# Patient Record
Sex: Female | Born: 1995 | Race: White | Hispanic: No | Marital: Single | Smoking: Never smoker
Health system: Southern US, Community
[De-identification: ages and names within clinical notes are randomized; demographics above are authoritative.]

## PROBLEM LIST (undated history)

## (undated) DIAGNOSIS — B379 Candidiasis, unspecified: Secondary | ICD-10-CM

## (undated) DIAGNOSIS — B977 Papillomavirus as the cause of diseases classified elsewhere: Secondary | ICD-10-CM

## (undated) DIAGNOSIS — F3181 Bipolar II disorder: Secondary | ICD-10-CM

## (undated) DIAGNOSIS — F603 Borderline personality disorder: Secondary | ICD-10-CM

## (undated) DIAGNOSIS — Z789 Other specified health status: Secondary | ICD-10-CM

## (undated) DIAGNOSIS — N94819 Vulvodynia, unspecified: Secondary | ICD-10-CM

## (undated) DIAGNOSIS — D649 Anemia, unspecified: Secondary | ICD-10-CM

## (undated) DIAGNOSIS — N200 Calculus of kidney: Secondary | ICD-10-CM

## (undated) DIAGNOSIS — F419 Anxiety disorder, unspecified: Secondary | ICD-10-CM

## (undated) DIAGNOSIS — N390 Urinary tract infection, site not specified: Secondary | ICD-10-CM

## (undated) DIAGNOSIS — N730 Acute parametritis and pelvic cellulitis: Secondary | ICD-10-CM

## (undated) HISTORY — DX: Urinary tract infection, site not specified: N39.0

## (undated) HISTORY — DX: Calculus of kidney: N20.0

## (undated) HISTORY — DX: Anxiety disorder, unspecified: F41.9

## (undated) HISTORY — DX: Borderline personality disorder: F60.3

## (undated) HISTORY — DX: Candidiasis, unspecified: B37.9

## (undated) HISTORY — DX: Papillomavirus as the cause of diseases classified elsewhere: B97.7

## (undated) HISTORY — DX: Bipolar II disorder: F31.81

## (undated) HISTORY — DX: Acute parametritis and pelvic cellulitis: N73.0

## (undated) HISTORY — PX: NO PAST SURGERIES: SHX2092

## (undated) HISTORY — DX: Anemia, unspecified: D64.9

## (undated) HISTORY — DX: Vulvodynia, unspecified: N94.819

---
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
## 2017-08-06 NOTE — L&D Delivery Note (Signed)
Operative Delivery Note At 10:00 AM a viable female was delivered via Vaginal, Forceps.  Presentation: vertex; Position: straight OP; Station: +2.  Verbal consent: obtained from patient.  Risks and benefits discussed in detail.  Risks include, but are not limited to the risks of anesthesia, bleeding, infection, damage to maternal tissues, fetal cephalhematoma.  There is also the risk of inability to effect vaginal delivery of the head, or shoulder dystocia that cannot be resolved by established maneuvers, leading to the need for emergency cesarean section.  APGAR: 9, 9; weight pending.   Placenta status: spontaneous, intact, circumvallate.   Cord: 3VC, body/shoulder cord x 1 with the following complications: bradycardia shortly after AROM, patient noted to be complete, little descent with pushing.  Cord pH: N/A  Anesthesia: Epidural   Instruments: Short fenestrated Elliots Episiotomy:  none Lacerations:  none Suture Repair: N/A Est. Blood Loss (mL):  300mL  Mom to postpartum.  Baby to Couplet care / Skin to Skin.  Sara Mcneil 02/16/2018, 10:10 AM

## 2017-11-12 DIAGNOSIS — F419 Anxiety disorder, unspecified: Secondary | ICD-10-CM | POA: Insufficient documentation

## 2017-11-12 DIAGNOSIS — N390 Urinary tract infection, site not specified: Secondary | ICD-10-CM | POA: Insufficient documentation

## 2017-11-12 LAB — OB RESULTS CONSOLE VARICELLA ZOSTER ANTIBODY, IGG: VARICELLA IGG: NON-IMMUNE/NOT IMMUNE

## 2017-11-12 LAB — OB RESULTS CONSOLE RUBELLA ANTIBODY, IGM: RUBELLA: UNDETERMINED

## 2017-11-12 LAB — OB RESULTS CONSOLE HEPATITIS B SURFACE ANTIGEN: HEP B S AG: NEGATIVE

## 2017-11-12 LAB — OB RESULTS CONSOLE HIV ANTIBODY (ROUTINE TESTING): HIV: NONREACTIVE

## 2017-11-12 LAB — OB RESULTS CONSOLE RPR: RPR: NONREACTIVE

## 2017-11-18 DIAGNOSIS — O09899 Supervision of other high risk pregnancies, unspecified trimester: Secondary | ICD-10-CM | POA: Insufficient documentation

## 2018-01-13 DIAGNOSIS — O280 Abnormal hematological finding on antenatal screening of mother: Secondary | ICD-10-CM | POA: Insufficient documentation

## 2018-01-13 DIAGNOSIS — O99013 Anemia complicating pregnancy, third trimester: Secondary | ICD-10-CM | POA: Insufficient documentation

## 2018-01-13 DIAGNOSIS — D509 Iron deficiency anemia, unspecified: Secondary | ICD-10-CM | POA: Insufficient documentation

## 2018-02-12 LAB — OB RESULTS CONSOLE GC/CHLAMYDIA
CHLAMYDIA, DNA PROBE: NEGATIVE
Gonorrhea: NEGATIVE

## 2018-02-12 LAB — OB RESULTS CONSOLE GBS: GBS: NEGATIVE

## 2018-02-16 ENCOUNTER — Other Ambulatory Visit: Payer: Self-pay

## 2018-02-16 ENCOUNTER — Inpatient Hospital Stay
Admission: EM | Admit: 2018-02-16 | Discharge: 2018-02-18 | DRG: 807 | Disposition: A | Discharge status: Home / Self Care | Payer: Medicaid Other | Attending: Obstetrics and Gynecology | Admitting: Obstetrics and Gynecology

## 2018-02-16 ENCOUNTER — Inpatient Hospital Stay: Payer: Medicaid Other | Admitting: Anesthesiology

## 2018-02-16 DIAGNOSIS — O9902 Anemia complicating childbirth: Secondary | ICD-10-CM

## 2018-02-16 DIAGNOSIS — O99344 Other mental disorders complicating childbirth: Secondary | ICD-10-CM | POA: Diagnosis present

## 2018-02-16 DIAGNOSIS — O9081 Anemia of the puerperium: Secondary | ICD-10-CM | POA: Diagnosis not present

## 2018-02-16 DIAGNOSIS — Z3A35 35 weeks gestation of pregnancy: Secondary | ICD-10-CM | POA: Diagnosis not present

## 2018-02-16 DIAGNOSIS — O9989 Other specified diseases and conditions complicating pregnancy, childbirth and the puerperium: Secondary | ICD-10-CM | POA: Diagnosis not present

## 2018-02-16 DIAGNOSIS — F419 Anxiety disorder, unspecified: Secondary | ICD-10-CM | POA: Diagnosis present

## 2018-02-16 HISTORY — DX: Other specified health status: Z78.9

## 2018-02-16 LAB — TYPE AND SCREEN
ABO/RH(D): O POS
ANTIBODY SCREEN: NEGATIVE

## 2018-02-16 LAB — CBC
HEMATOCRIT: 29 % — AB (ref 35.0–47.0)
HEMOGLOBIN: 9.7 g/dL — AB (ref 12.0–16.0)
MCH: 27.9 pg (ref 26.0–34.0)
MCHC: 33.3 g/dL (ref 32.0–36.0)
MCV: 83.6 fL (ref 80.0–100.0)
Platelets: 232 10*3/uL (ref 150–440)
RBC: 3.47 MIL/uL — ABNORMAL LOW (ref 3.80–5.20)
RDW: 16.3 % — ABNORMAL HIGH (ref 11.5–14.5)
WBC: 12.8 10*3/uL — AB (ref 3.6–11.0)

## 2018-02-16 MED ORDER — LIDOCAINE HCL 1 % IJ SOLN
20.00 | INTRAMUSCULAR | Status: DC
Start: ? — End: 2018-02-16

## 2018-02-16 MED ORDER — ACETAMINOPHEN 325 MG PO TABS: 650.0000 mg | ORAL_TABLET | ORAL | Status: DC | PRN

## 2018-02-16 MED ORDER — LACTATED RINGERS IV SOLN: 500.0000 mL | INTRAVENOUS | Status: DC | PRN

## 2018-02-16 MED ORDER — LACTATED RINGERS IV SOLN
INTRAVENOUS | Status: DC
  Administered 2018-02-16: 07:00:00 via INTRAVENOUS

## 2018-02-16 MED ORDER — DIPHENHYDRAMINE HCL 50 MG/ML IJ SOLN: 12.5000 mg | INTRAMUSCULAR | Status: DC | PRN

## 2018-02-16 MED ORDER — DIPHENHYDRAMINE HCL 25 MG PO CAPS: 25.0000 mg | ORAL_CAPSULE | Freq: Four times a day (QID) | ORAL | Status: DC | PRN

## 2018-02-16 MED ORDER — BUTORPHANOL TARTRATE 1 MG/ML IJ SOLN
1.0000 mg | INTRAMUSCULAR | Status: DC | PRN
Start: 2018-02-16 — End: 2018-02-16
  Administered 2018-02-16 (×2): 1 mg via INTRAVENOUS
  Filled 2018-02-16 (×2): qty 1

## 2018-02-16 MED ORDER — EPHEDRINE 5 MG/ML INJ
10.0000 mg | INTRAVENOUS | Status: DC | PRN
  Filled 2018-02-16: qty 2

## 2018-02-16 MED ORDER — PHENYLEPHRINE 40 MCG/ML (10ML) SYRINGE FOR IV PUSH (FOR BLOOD PRESSURE SUPPORT)
80.0000 ug | PREFILLED_SYRINGE | INTRAVENOUS | Status: DC | PRN
  Filled 2018-02-16: qty 5

## 2018-02-16 MED ORDER — WITCH HAZEL-GLYCERIN EX PADS
1.0000 "application " | MEDICATED_PAD | CUTANEOUS | Status: DC | PRN
  Filled 2018-02-16: qty 100

## 2018-02-16 MED ORDER — COCONUT OIL OIL
1.0000 "application " | TOPICAL_OIL | Status: DC | PRN
  Administered 2018-02-16: 1 via TOPICAL
  Filled 2018-02-16: qty 120

## 2018-02-16 MED ORDER — FENTANYL 2.5 MCG/ML W/ROPIVACAINE 0.15% IN NS 100 ML EPIDURAL (ARMC): 12.0000 mL/h | EPIDURAL | Status: DC

## 2018-02-16 MED ORDER — FENTANYL 2.5 MCG/ML W/ROPIVACAINE 0.15% IN NS 100 ML EPIDURAL (ARMC)
EPIDURAL | Status: AC
  Filled 2018-02-16: qty 100

## 2018-02-16 MED ORDER — MISOPROSTOL 200 MCG PO TABS
1000.00 | ORAL_TABLET | ORAL | Status: DC
Start: ? — End: 2018-02-16

## 2018-02-16 MED ORDER — OXYTOCIN 40 UNITS IN LACTATED RINGERS INFUSION - SIMPLE MED
2.5000 [IU]/h | INTRAVENOUS | Status: DC
  Administered 2018-02-16: 2.5 [IU]/h via INTRAVENOUS
  Filled 2018-02-16: qty 1000

## 2018-02-16 MED ORDER — LACTATED RINGERS IV SOLN: 500.0000 mL | Freq: Once | INTRAVENOUS | Status: DC

## 2018-02-16 MED ORDER — METHYLERGONOVINE MALEATE 0.2 MG/ML IJ SOLN
0.20 | INTRAMUSCULAR | Status: DC
Start: ? — End: 2018-02-16

## 2018-02-16 MED ORDER — SIMETHICONE 80 MG PO CHEW: 80.0000 mg | CHEWABLE_TABLET | ORAL | Status: DC | PRN

## 2018-02-16 MED ORDER — CARBOPROST TROMETHAMINE 250 MCG/ML IM SOLN
250.00 | INTRAMUSCULAR | Status: DC
Start: ? — End: 2018-02-16

## 2018-02-16 MED ORDER — MISOPROSTOL 200 MCG PO TABS
ORAL_TABLET | ORAL | Status: AC
  Filled 2018-02-16: qty 4

## 2018-02-16 MED ORDER — PRENATAL MULTIVITAMIN CH
1.0000 | ORAL_TABLET | Freq: Every day | ORAL | Status: DC
  Administered 2018-02-16 – 2018-02-17 (×2): 1 via ORAL
  Filled 2018-02-16 (×2): qty 1

## 2018-02-16 MED ORDER — ONDANSETRON HCL 4 MG/2ML IJ SOLN: 4.0000 mg | Freq: Four times a day (QID) | INTRAMUSCULAR | Status: DC | PRN

## 2018-02-16 MED ORDER — OXYTOCIN-SODIUM CHLORIDE 20-0.9 UT/500ML-% IV SOLN
10.00 | INTRAVENOUS | Status: DC
Start: ? — End: 2018-02-16

## 2018-02-16 MED ORDER — ONDANSETRON HCL 4 MG PO TABS: 4.0000 mg | ORAL_TABLET | ORAL | Status: DC | PRN

## 2018-02-16 MED ORDER — CALCIUM CARBONATE ANTACID 750 MG PO CHEW
CHEWABLE_TABLET | ORAL | Status: DC
Start: 2018-02-14 — End: 2018-02-16

## 2018-02-16 MED ORDER — LIDOCAINE HCL 1 % IJ SOLN
.50 | INTRAMUSCULAR | Status: DC
Start: ? — End: 2018-02-16

## 2018-02-16 MED ORDER — OXYCODONE-ACETAMINOPHEN 5-325 MG PO TABS: 1.0000 | ORAL_TABLET | ORAL | Status: DC | PRN

## 2018-02-16 MED ORDER — LIDOCAINE-EPINEPHRINE (PF) 1.5 %-1:200000 IJ SOLN
INTRAMUSCULAR | Status: DC | PRN
  Administered 2018-02-16: 3 mL via EPIDURAL

## 2018-02-16 MED ORDER — LIDOCAINE HCL (PF) 1 % IJ SOLN
30.0000 mL | INTRAMUSCULAR | Status: AC | PRN
  Administered 2018-02-16: 1.2 mL via SUBCUTANEOUS
  Filled 2018-02-16: qty 30

## 2018-02-16 MED ORDER — IBUPROFEN 600 MG PO TABS
600.0000 mg | ORAL_TABLET | Freq: Four times a day (QID) | ORAL | Status: DC
  Administered 2018-02-16 – 2018-02-18 (×7): 600 mg via ORAL
  Filled 2018-02-16 (×7): qty 1

## 2018-02-16 MED ORDER — AMPICILLIN SODIUM 2 G IJ SOLR
2.0000 g | Freq: Once | INTRAMUSCULAR | Status: AC
  Administered 2018-02-16: 2 g via INTRAVENOUS
  Filled 2018-02-16: qty 2000

## 2018-02-16 MED ORDER — BENZOCAINE-MENTHOL 20-0.5 % EX AERO
1.0000 "application " | INHALATION_SPRAY | CUTANEOUS | Status: DC | PRN
  Filled 2018-02-16: qty 56

## 2018-02-16 MED ORDER — SENNOSIDES-DOCUSATE SODIUM 8.6-50 MG PO TABS
2.0000 | ORAL_TABLET | ORAL | Status: DC
  Administered 2018-02-17 – 2018-02-18 (×2): 2 via ORAL
  Filled 2018-02-16 (×2): qty 2

## 2018-02-16 MED ORDER — SOD CITRATE-CITRIC ACID 500-334 MG/5ML PO SOLN: 30.0000 mL | ORAL | Status: DC | PRN

## 2018-02-16 MED ORDER — OXYTOCIN BOLUS FROM INFUSION
500.0000 mL | Freq: Once | INTRAVENOUS | Status: AC
  Administered 2018-02-16: 500 mL via INTRAVENOUS

## 2018-02-16 MED ORDER — ONDANSETRON HCL 4 MG/2ML IJ SOLN: 4.0000 mg | INTRAMUSCULAR | Status: DC | PRN

## 2018-02-16 MED ORDER — FENTANYL CITRATE (PF) 50 MCG/ML IJ SOLN
50.00 | INTRAMUSCULAR | Status: DC
Start: ? — End: 2018-02-16

## 2018-02-16 MED ORDER — ONDANSETRON HCL 4 MG/2ML IJ SOLN
4.00 | INTRAMUSCULAR | Status: DC
Start: ? — End: 2018-02-16

## 2018-02-16 MED ORDER — FENTANYL 2.5 MCG/ML W/ROPIVACAINE 0.15% IN NS 100 ML EPIDURAL (ARMC)
EPIDURAL | Status: DC | PRN
  Administered 2018-02-16: 12 mL/h via EPIDURAL

## 2018-02-16 MED ORDER — LACTATED RINGERS IV SOLN
INTRAVENOUS | Status: DC
Start: ? — End: 2018-02-16

## 2018-02-16 MED ORDER — DIBUCAINE 1 % RE OINT
1.0000 "application " | TOPICAL_OINTMENT | RECTAL | Status: DC | PRN
  Filled 2018-02-16: qty 28

## 2018-02-16 MED ORDER — OXYCODONE-ACETAMINOPHEN 5-325 MG PO TABS: 2.0000 | ORAL_TABLET | ORAL | Status: DC | PRN

## 2018-02-16 MED ORDER — PANTOPRAZOLE SODIUM 40 MG PO TBEC
40.00 | DELAYED_RELEASE_TABLET | ORAL | Status: DC
Start: 2018-02-15 — End: 2018-02-16

## 2018-02-16 NOTE — Plan of Care (Signed)
Forceps assisted vaginal delivery of viable baby boy over intact perineum.

## 2018-02-16 NOTE — Discharge Summary (Signed)
Obstetric Discharge Summary Reason for Admission: onset of labor Prenatal Procedures: none Intrapartum Procedures: forceps (low) Postpartum Procedures: none Complications-Operative and Postpartum: none Hemoglobin  Date Value Ref Range Status  02/17/2018 9.8 (L) 12.0 - 16.0 g/dL Final   HCT  Date Value Ref Range Status  02/17/2018 29.9 (L) 35.0 - 47.0 % Final    Physical Exam:  General: alert, cooperative and no distress Lochia: appropriate Uterine Fundus: firm Incision: N/A DVT Evaluation: No evidence of DVT seen on physical exam.  Discharge Diagnoses: Term Pregnancy-delivered  Discharge Information: Date: 02/18/2018 Activity: pelvic rest Diet: routine Allergies as of 02/18/2018      Reactions   Iodine    Sulfa Antibiotics       Medication List    TAKE these medications   multivitamin-prenatal 27-0.8 MG Tabs tablet Take 1 tablet by mouth daily at 12 noon.       Condition: good Discharge to: home Follow-up Information    [facility]   Why:  postpartum visit Contact information: [address] [facility] [address] [place] [address] [phone]           Newborn Data: Live born female  Birth Weight:   APGAR: 9, 9  Newborn Delivery   Birth date/time:  02/16/2018 10:00:00 Delivery type:  Vaginal, Forceps     Home with mother.  Sara Mcneil 02/18/2018, 9:30 AM

## 2018-02-16 NOTE — Anesthesia Procedure Notes (Signed)
Epidural Patient location during procedure: OB Start time: 02/16/2018 8:35 AM End time: 02/16/2018 8:56 AM  Preanesthetic Checklist Completed: patient identified, site marked, surgical consent, pre-op evaluation, timeout performed, IV checked, risks and benefits discussed and monitors and equipment checked  Epidural Patient position: sitting Prep: Betadine Patient monitoring: heart rate, continuous pulse ox and blood pressure Approach: midline Location: L3-L4 Injection technique: LOR saline  Needle:  Needle type: Tuohy  Needle gauge: 17 G Needle length: 9 cm and 9 Needle insertion depth: 6 cm Catheter type: closed end flexible Catheter size: 20 Guage Catheter at skin depth: 10 cm Test dose: negative and 1.5% lidocaine with Epi 1:200 K  Assessment Events: blood not aspirated, injection not painful, no injection resistance, negative IV test and no paresthesia  Additional Notes   Patient tolerated the insertion well without complications.Reason for block:procedure for pain

## 2018-02-16 NOTE — H&P (Signed)
Obstetric H&P   Chief Complaint: Contractions  Prenatal Care Provider: [facility]  History of Present Illness: 22 y.o. G2P0101 6526w4d by 03/19/2018, by 17 week US presenting to L&D with contractions.  Pregnancy notable for history of prior preterm birth at 35 weeks.  Has been managed on 17-P injections this pregnancy.  [facility] with cervix remaining unchanged at 4cm.  The patient has GBS collected at that time (negative) and received BMZ injections.  Her pergnancy is otherwise only notable for anxiety and anemia.  +FM, no LOF, no VB, +ctx.  Pregravid weight Pregravid weight not on file Total Weight Gain Not found.   Review of Systems: 10 point review of systems negative unless otherwise noted in HPI  Past Medical History: Past Medical History:  Diagnosis Date  . Medical history non-contributory     Past Surgical History: Past Surgical History:  Procedure Laterality Date  . NO PAST SURGERIES      Past Obstetric History: #: 1, Date: None, Sex: None, Weight: None, GA: 706w0d, Delivery: Vaginal, Spontaneous, Apgar1: None, Apgar5: None, Living: Living, Birth Comments: None  #: 2, Date: None, Sex: None, Weight: None, GA: None, Delivery: None, Apgar1: None, Apgar5: None, Living: None, Birth Comments: None  Family History: History reviewed. No pertinent family history.  Social History: Social History   Socioeconomic History  . Marital status: Single    Spouse name: Not on file  . Number of children: Not on file  . Years of education: Not on file  . Highest education level: Not on file  Occupational History  . Not on file  Social Needs  . Financial resource strain: Not on file  . Food insecurity:    Worry: Not on file    Inability: Not on file  . Transportation needs:    Medical: Not on file    Non-medical: Not on file  Tobacco Use  . Smoking status: Never Smoker  . Smokeless tobacco: Never Used  Substance and Sexual Activity  . Alcohol use:  Not Currently  . Drug use: Not Currently  . Sexual activity: Yes    Comment: Undecided  Lifestyle  . Physical activity:    Days per week: Not on file    Minutes per session: Not on file  . Stress: Not on file  Relationships  . Social connections:    Talks on phone: Not on file    Gets together: Not on file    Attends religious service: Not on file    Active member of club or organization: Not on file    Attends meetings of clubs or organizations: Not on file    Relationship status: Not on file  . Intimate partner violence:    Fear of current or ex partner: Not on file    Emotionally abused: Not on file    Physically abused: Not on file    Forced sexual activity: Not on file  Other Topics Concern  . Not on file  Social History Narrative  . Not on file    Medications: Prior to Admission medications   Not on File    Allergies: Allergies  Allergen Reactions  . Iodine   . Sulfa Antibiotics     Physical Exam: Vitals: Pulse 87, temperature 98.5 F (36.9 C), temperature source Axillary, resp. rate 18, height 5\' 8"  (1.727 m), weight 150 lb (68 kg).  FHT: 140, moderate, +accels, no decels Toco: q411min  General: NAD HEENT: normocephalic, anicteric Pulmonary: No increased work  of breathing Cardiovascular: RRR, distal pulses 2+ Abdomen: Gravid, non-tender Leopolds: vtx Genitourinary:  Dilation: 6 Effacement (%): 100 Station: 0 Exam by:: Dietrich Pates, RN   Extremities: no edema, erythema, or tenderness Neurologic: Grossly intact Psychiatric: mood appropriate, affect full  Labs: Results for orders placed or performed during the hospital encounter of 02/16/18 (from the past 24 hour(s))  CBC     Status: Abnormal   Collection Time: 02/16/18  7:15 AM  Result Value Ref Range   WBC 12.8 (H) 3.6 - 11.0 K/uL   RBC 3.47 (L) 3.80 - 5.20 MIL/uL   Hemoglobin 9.7 (L) 12.0 - 16.0 g/dL   HCT 16.1 (L) 09.6 - 04.5 %   MCV 83.6 80.0 - 100.0 fL   MCH 27.9 26.0 - 34.0 pg   MCHC  33.3 32.0 - 36.0 g/dL   RDW 40.9 (H) 81.1 - 91.4 %   Platelets 232 150 - 440 K/uL   Clinic Sara Mcneil Prenatal Labs  Dating 17 week [place] Blood type: O positive  Genetic Screen 1 Screen: negative CF and SMA negative Antibody:negative  Anatomic [place] Normal Rubella: Equivocal Varicella: Non-Immune  GTT 98 RPR: NR  Rhogam N/A HBsAg: negative  TDaP vaccine 09/12/2014 HIV: negative  Baby Food                                GBS: Negative  Contraception  NWG:NFAOZ 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 G2P0101 [redacted]w[redacted]d by 03/19/2018, by 17 week [place] presenting in term labor  Plan: 1) Preterm labor - received BMZ x 2 admission at [facility] 02/12/2018 - GBS negative 02/12/2018  2) Fetus - - ultrasound 01/15/2018 1,558g 20% - prior 35 week delivery with G3 preganncy - 1-hr 98 - Pelvis tested to 6lbs 6oz, 2.816kg  3) PNL - Blood type O positive/ Anti-bodyscreen negative / CF carrier screen negative / SMA negative /  1st trimester screen negative / Hgb AA /  Rubella Equivocal / Varicella Non-Immune / RPR NR / HBsAg negative / HIV negative / 1-hr 98 / GC&CT negative 02/12/2018 / GBS negative 02/12/2018  4) Immunization History - TDAP received 09/12/14  5) Disposition - pending delivery  Vena Austria, MD, [facility], [facility] 02/16/2018, 7:49 AM

## 2018-02-16 NOTE — Anesthesia Preprocedure Evaluation (Signed)
Anesthesia Evaluation  Patient identified by MRN, date of birth, ID band Patient awake    Reviewed: Allergy & Precautions, NPO status , Patient's Chart, lab work & pertinent test results  History of Anesthesia Complications Negative for: history of anesthetic complications  Airway Mallampati: II       Dental   Pulmonary neg sleep apnea, neg COPD,           Cardiovascular (-) hypertension(-) Past MI and (-) CHF (-) dysrhythmias (-) Valvular Problems/Murmurs     Neuro/Psych    GI/Hepatic Neg liver ROS, GERD (with pregnancy)  Medicated,  Endo/Other  neg diabetes  Renal/GU negative Renal ROS     Musculoskeletal   Abdominal   Peds  Hematology   Anesthesia Other Findings   Reproductive/Obstetrics (+) Pregnancy                             Anesthesia Physical Anesthesia Plan  ASA: II  Anesthesia Plan: Epidural   Post-op Pain Management:    Induction:   PONV Risk Score and Plan:   Airway Management Planned:   Additional Equipment:   Intra-op Plan:   Post-operative Plan:   Informed Consent: I have reviewed the patients History and Physical, chart, labs and discussed the procedure including the risks, benefits and alternatives for the proposed anesthesia with the patient or authorized representative who has indicated his/her understanding and acceptance.     Plan Discussed with:   Anesthesia Plan Comments:         Anesthesia Quick Evaluation

## 2018-02-16 NOTE — Progress Notes (Signed)
   Subjective:  Comfortable with epidural in place  Objective:   Vitals: Blood pressure 100/65, pulse 95, temperature 98.5 F (36.9 C), temperature source Axillary, resp. rate 17, height 5\' 8"  (1.727 m), weight 150 lb (68 kg), SpO2 100 %. General: NAD Abdomen: Gravid, non-tender Cervical Exam:  Dilation: 9 Effacement (%): 100 Station: -1, 0 Presentation: Vertex Exam by:: Dr.Belen Zwahlen  FHT: 130, moderate, +accels, no decels Toco:753min  Results for orders placed or performed during the hospital encounter of 02/16/18 (from the past 24 hour(s))  CBC     Status: Abnormal   Collection Time: 02/16/18  7:15 AM  Result Value Ref Range   WBC 12.8 (H) 3.6 - 11.0 K/uL   RBC 3.47 (L) 3.80 - 5.20 MIL/uL   Hemoglobin 9.7 (L) 12.0 - 16.0 g/dL   HCT 04.529.0 (L) 40.935.0 - 81.147.0 %   MCV 83.6 80.0 - 100.0 fL   MCH 27.9 26.0 - 34.0 pg   MCHC 33.3 32.0 - 36.0 g/dL   RDW 91.416.3 (H) 78.211.5 - 95.614.5 %   Platelets 232 150 - 440 K/uL  Type and screen [facility]     Status: None   Collection Time: 02/16/18  7:15 AM  Result Value Ref Range   ABO/RH(D) O POS    Antibody Screen NEG    Sample Expiration      02/19/2018 Performed at [facility], [address] [place], [place] [address]     Assessment:   22 y.o. [phone] 4652w4d preterm labor  Plan:   1) Labor - AROM clear  2) Fetus - cat I  Vena AustriaAndreas Inioluwa Baris, MD, [facility] Frederick[facility]ACOG [facility], [facility] 02/16/2018, 9:45 AM

## 2018-02-16 NOTE — OB Triage Note (Signed)
Patient came in from home c/o of contractions that started at 0430 this morning. Patient reports to recent [facility] admission for preterm labor and discharge Friday. Patient reports being 4 cm and 70% on Friday. Reports positive fetal movement. Denies LOF. Monitors applied and assessing.

## 2018-02-16 NOTE — Lactation Note (Signed)
This note was copied from a baby's chart. Lactation Consultation Note  Patient Name: Sara Mcneil [id number]'s Date: 02/16/2018 Reason for consult: Follow-up assessment;Late-preterm 34-36.6wks;Mother's request;Infant < 6lbs  Mayer Camelatum is more alert for this feeding.  Can more easily hand express more colostrum.  He roots and goes on the breast with wide open mouth and flanged lips.  LC had to compress breast and hold in his mouth through entire feeding to keep him actively sucking.  Mom experiencing a lot more nipple discomfort as she reports his suck is getting really strong.  Mom reports having low pain tolerance anyway as it pertains to her nipples not wanting them to be touched.  She reports she is going to keep breast feeding because she knows it is the best for her baby.  Praised mom for making that commitment.  Instructions given on coconut oil.   Maternal Data Formula Feeding for Exclusion: No Has patient been taught Hand Expression?: Yes(LC has been able to hand express colostrum more than mom) Does the patient have breastfeeding experience prior to this delivery?: (Attempted, but was unsuccessful)  Feeding Feeding Type: Breast Fed Length of feed: 10 min  LATCH Score Latch: Repeated attempts needed to sustain latch, nipple held in mouth throughout feeding, stimulation needed to elicit sucking reflex.  Audible Swallowing: A few with stimulation  Type of Nipple: Everted at rest and after stimulation  Comfort (Breast/Nipple): Filling, red/small blisters or bruises, mild/mod discomfort  Hold (Positioning): Assistance needed to correctly position infant at breast and maintain latch.  LATCH Score: 6  Interventions Interventions: Assisted with latch;Reverse pressure;Coconut oil  Lactation Tools Discussed/Used Tools: Coconut oil   Consult Status Consult Status: Follow-up Date: 02/16/18 Follow-up type: Call as needed    Sara Mcneil, Sara Mcneil 02/16/2018, 4:08 PM

## 2018-02-16 NOTE — Lactation Note (Signed)
This note was copied from a baby's chart. Lactation Consultation Note  Patient Name: Sara Anner CreteDestiny Diegel [id number]'UToday's Date: 02/16/2018 Reason for consult: Initial assessment;Late-preterm 34-36.6wks;1st time breastfeeding  To [facility] to assist with first breast feeding of slightly lethargic 35.4 week newborn.  Assisted mom to get in comfortable position in modified cross cradle hold with pillow support skin to skin.  Demonstrated waking techniques to entice Mayer Camelatum to wake and latch to breast.  Demonstrated hand expression and got a couple of drops after several attempts.  At first Ash[place] would latch, but refused to suck.  Stimulated nape of neck which helped to achieve deeper latch and he began a few rhythmic sucks.  Frequent stimulation and massaging of breast was needed through out feeding attempt for any nutritive sucking.  Mom did verbalize she could feel strong tugs the few sucking periods we had and heard a couple of audible swallows.  Mom was unsuccessful with breat feeding first baby who [facility].  He had to get DBM in beginning.  Mom really wants to breast feed this baby.  Temperature was originally low, but after feeding kept Tatum skin to skin with warm blanket on top and his temperature came up from 96.9 to 98.1 in 40 minutes.  His blood glucose was 65 within an hour after breast feeding.  Encouraged mom to continue with skin to skin and put baby to breast every couple of hours or when he demonstrated any feeding cues.  Encouraged mom to call with any questions, concerns or assistance.   Maternal Data Formula Feeding for Exclusion: No Has patient been taught Hand Expression?: Yes Does the patient have breastfeeding experience prior to this delivery?: Yes  Feeding Feeding Type: Breast Fed Length of feed: (Took a few strong sucks that come could feel)  LATCH Score Latch: Repeated attempts needed to sustain latch, nipple held in mouth throughout feeding, stimulation  needed to elicit sucking reflex.  Audible Swallowing: A few with stimulation(Heard a couple of swallows)  Type of Nipple: Everted at rest and after stimulation  Comfort (Breast/Nipple): Soft / non-tender  Hold (Positioning): Assistance needed to correctly position infant at breast and maintain latch.  LATCH Score: 7  Interventions Interventions: Breast feeding basics reviewed;Reverse pressure;Assisted with latch;Breast compression;Skin to skin;Adjust position;Breast massage;Support pillows;Position options  Lactation Tools Discussed/Used     Consult Status Consult Status: Follow-up Date: 02/16/18 Follow-up type: In-patient    Louis MeckelWilliams, Kauri Garson Kay 02/16/2018, 12:30 PM

## 2018-02-17 LAB — CBC
HCT: 29.9 % — ABNORMAL LOW (ref 35.0–47.0)
Hemoglobin: 9.8 g/dL — ABNORMAL LOW (ref 12.0–16.0)
MCH: 27.7 pg (ref 26.0–34.0)
MCHC: 32.8 g/dL (ref 32.0–36.0)
MCV: 84.5 fL (ref 80.0–100.0)
PLATELETS: 243 10*3/uL (ref 150–440)
RBC: 3.53 MIL/uL — AB (ref 3.80–5.20)
RDW: 15.9 % — AB (ref 11.5–14.5)
WBC: 12.6 10*3/uL — AB (ref 3.6–11.0)

## 2018-02-17 MED ORDER — FERROUS FUMARATE 324 (106 FE) MG PO TABS
1.0000 | ORAL_TABLET | Freq: Every day | ORAL | Status: DC
  Administered 2018-02-17 – 2018-02-18 (×2): 106 mg via ORAL
  Filled 2018-02-17 (×2): qty 1

## 2018-02-17 MED ORDER — CALCIUM CARBONATE ANTACID 500 MG PO CHEW
1.0000 | CHEWABLE_TABLET | Freq: Four times a day (QID) | ORAL | Status: DC | PRN
  Administered 2018-02-17: 200 mg via ORAL
  Filled 2018-02-17: qty 1

## 2018-02-17 NOTE — Progress Notes (Signed)
Post Partum Day 1 Subjective: up ad lib, voiding and tolerating PO. Difficulty getting baby to nurse during the night. Last breast fed well at 11PM. Last night.  Objective: Blood pressure 99/68, pulse 87, temperature 97.8 F (36.6 C), temperature source Oral, resp. rate 18, height 5\' 8"  (1.727 m), weight 68 kg (150 lb), SpO2 97 %, unknown if currently breastfeeding.  Physical Exam:  General: alert, cooperative and no distress Lochia: appropriate Uterine Fundus: firm/ U-2/slightly right of ML.NT  DVT Evaluation: No evidence of DVT seen on physical exam.  Recent Labs    02/16/18 0715 02/17/18 0538  HGB 9.7* 9.8*  HCT 29.0* 29.9*  WBC 12.8* 12.6*  PLT 232 243    Assessment/Plan: Stable PPD #1 Breast feeding difficulty-will consult lactation for assistance. May need to supplement O POS/ RNI/VNI TDAP-last received 2016 Breast Contraception: ?vasectomy, condoms Mild anemia postpartum: [facility]  Start po iron    LOS: 1 day   Farrel ConnersColleen Rennie Hack 02/17/2018, 8:23 AM

## 2018-02-17 NOTE — Anesthesia Postprocedure Evaluation (Signed)
Anesthesia Post Note  Patient: Sara Mcneil  Procedure(s) Performed: AN AD HOC LABOR EPIDURAL  Patient location during evaluation: Mother Baby Anesthesia Type: Epidural Level of consciousness: awake and alert and oriented Pain management: satisfactory to patient Vital Signs Assessment: post-procedure vital signs reviewed and stable Respiratory status: respiratory function stable Cardiovascular status: stable Postop Assessment: no backache, no headache, epidural receding, no apparent nausea or vomiting, patient able to bend at knees, adequate PO intake and able to ambulate Anesthetic complications: no     Last Vitals:  Vitals:   02/16/18 1947 02/17/18 0032  BP: 114/75 99/68  Pulse: 89 87  Resp: 20 18  Temp: 36.9 C 36.6 C  SpO2: 98% 97%    Last Pain:  Vitals:   02/17/18 0032  TempSrc: Oral  PainSc:                  Clydene PughBeane, Sherlyne Crownover D

## 2018-02-17 NOTE — Lactation Note (Signed)
This note was copied from a baby's chart. Lactation Consultation Note  Patient Name: Sara Mcneil Today's Date: 02/17/2018 This is mom's first child to breastfeed, first child was preterm also, a nipple shield was started overnight, baby tires easily at breast with me assisting at several attempts, may make few attempts at sucking once latched but tires quickly, no milk transfer noted in chamber of nipple shield, PNS set up at bedside for mom to pump for supplement, 1 cc obtained and given to baby by syringe and then 10 cc  Formula was given by mom to baby by bottle, infant took slowly, 15 cc formula was in bottle, before pumping baby was noted to have some grunting while being held by a family member, resolved once picked up and stimulated in crib, occasional gagginess observed, no stool noted since delivery  Maternal Data    Feeding Feeding Type: Breast Milk with Formula added Nipple Type: Slow - flow Length of feed: 30 min  LATCH Score                   Interventions    Lactation Tools Discussed/Used Tools: Pump(curved tip syringe)   Consult Status      Sara Mcneil 02/17/2018, 6:35 PM

## 2018-02-18 LAB — RPR: RPR Ser Ql: NONREACTIVE

## 2018-02-18 MED ORDER — VARICELLA VIRUS VACCINE LIVE 1350 PFU/0.5ML IJ SUSR
0.5000 mL | Freq: Once | INTRAMUSCULAR | Status: AC
  Administered 2018-02-18: 0.5 mL via SUBCUTANEOUS
  Filled 2018-02-18: qty 0.5

## 2018-02-18 MED ORDER — MEASLES, MUMPS & RUBELLA VAC ~~LOC~~ INJ: 0.5000 mL | INJECTION | Freq: Once | SUBCUTANEOUS | Status: DC

## 2018-02-18 MED ORDER — MEASLES, MUMPS & RUBELLA VAC ~~LOC~~ INJ
0.5000 mL | INJECTION | Freq: Once | SUBCUTANEOUS | Status: AC
  Administered 2018-02-18: 0.5 mL via SUBCUTANEOUS
  Filled 2018-02-18: qty 0.5

## 2018-02-18 NOTE — Progress Notes (Signed)
Dc instr reveiwed with pt.  Verb u/o of now the NB being a PEDS pt.

## 2018-02-18 NOTE — Lactation Note (Signed)
This note was copied from a baby's chart. Lactation Consultation Note  Patient Name: Sara Mcneil Today's Date: 02/18/2018    [facility], mom c/o frustration at not seeing milk when she pumps. She states LC already sized her for flanges and that she knows how to hand express. I reviewed expectations of milk supply at this time period and encouraged lots of skin to skin; hand expression and continued frequent pumping. If baby is vigorous, she can call me to help with breastfeeding if she wishes.   She does not have a breast pump at home yet. [place] says she plans to call them this morning. I also reviewed all of her other pump options. She has LC contact info. I did suggest she call me for next feeding and/or pumping so I can see if I have any other suggestions for her.    Maternal Data    Feeding Feeding Type: Bottle Fed - Formula Nipple Type: Slow - flow Length of feed: 20 min  LATCH Score                   Interventions    Lactation Tools Discussed/Used     Consult Status      Sara Mcneil 02/18/2018, 2:53 PM

## 2019-03-05 LAB — FETAL NONSTRESS TEST

## 2019-07-08 ENCOUNTER — Emergency Department: Payer: Medicaid Other

## 2019-07-08 ENCOUNTER — Other Ambulatory Visit: Payer: Self-pay

## 2019-07-08 ENCOUNTER — Encounter: Payer: Self-pay | Admitting: Emergency Medicine

## 2019-07-08 ENCOUNTER — Emergency Department
Admission: EM | Admit: 2019-07-08 | Discharge: 2019-07-08 | Disposition: A | Discharge status: Home / Self Care | Payer: Medicaid Other | Attending: Emergency Medicine | Admitting: Emergency Medicine

## 2019-07-08 DIAGNOSIS — N730 Acute parametritis and pelvic cellulitis: Secondary | ICD-10-CM

## 2019-07-08 DIAGNOSIS — N739 Female pelvic inflammatory disease, unspecified: Secondary | ICD-10-CM | POA: Insufficient documentation

## 2019-07-08 DIAGNOSIS — Z79899 Other long term (current) drug therapy: Secondary | ICD-10-CM | POA: Diagnosis not present

## 2019-07-08 DIAGNOSIS — R102 Pelvic and perineal pain: Secondary | ICD-10-CM | POA: Diagnosis present

## 2019-07-08 LAB — COMPREHENSIVE METABOLIC PANEL
ALT: 21 U/L (ref 0–44)
AST: 20 U/L (ref 15–41)
Albumin: 4.6 g/dL (ref 3.5–5.0)
Alkaline Phosphatase: 62 U/L (ref 38–126)
Anion gap: 7 (ref 5–15)
BUN: 14 mg/dL (ref 6–20)
CO2: 27 mmol/L (ref 22–32)
Calcium: 9.1 mg/dL (ref 8.9–10.3)
Chloride: 104 mmol/L (ref 98–111)
Creatinine, Ser: 0.58 mg/dL (ref 0.44–1.00)
GFR calc Af Amer: >60 mL/min (ref >60)
GFR calc non Af Amer: >60 mL/min (ref >60)
Glucose, Bld: 100 mg/dL — ABNORMAL HIGH (ref 70–99)
Potassium: 3.8 mmol/L (ref 3.5–5.1)
Sodium: 138 mmol/L (ref 135–145)
Total Bilirubin: 0.6 mg/dL (ref 0.3–1.2)
Total Protein: 7.4 g/dL (ref 6.5–8.1)

## 2019-07-08 LAB — URINALYSIS, COMPLETE (UACMP) WITH MICROSCOPIC
Bacteria, UA: NONE SEEN
Bilirubin Urine: NEGATIVE
Glucose, UA: NEGATIVE mg/dL
Hgb urine dipstick: NEGATIVE
Ketones, ur: NEGATIVE mg/dL
Nitrite: NEGATIVE
Protein, ur: NEGATIVE mg/dL
Specific Gravity, Urine: 1.025 (ref 1.005–1.030)
pH: 6 (ref 5.0–8.0)

## 2019-07-08 LAB — CBC
HCT: 37.5 % (ref 36.0–46.0)
Hemoglobin: 12.8 g/dL (ref 12.0–15.0)
MCH: 31.4 pg (ref 26.0–34.0)
MCHC: 34.1 g/dL (ref 30.0–36.0)
MCV: 91.9 fL (ref 80.0–100.0)
Platelets: 312 10*3/uL (ref 150–400)
RBC: 4.08 MIL/uL (ref 3.87–5.11)
RDW: 12.4 % (ref 11.5–15.5)
WBC: 5.3 10*3/uL (ref 4.0–10.5)
nRBC: 0 % (ref 0.0–0.2)

## 2019-07-08 LAB — WET PREP, GENITAL
Clue Cells Wet Prep HPF POC: NONE SEEN
Sperm: NONE SEEN
Trich, Wet Prep: NONE SEEN
Yeast Wet Prep HPF POC: NONE SEEN

## 2019-07-08 LAB — POCT PREGNANCY, URINE: Preg Test, Ur: NEGATIVE

## 2019-07-08 LAB — LIPASE, BLOOD: Lipase: 39 U/L (ref 11–51)

## 2019-07-08 MED ORDER — DOXYCYCLINE HYCLATE 100 MG PO CAPS: 100.0000 mg | ORAL_CAPSULE | Freq: Two times a day (BID) | ORAL | 0 refills | Status: AC

## 2019-07-08 MED ORDER — SODIUM CHLORIDE 0.9% FLUSH: 3.0000 mL | Freq: Once | INTRAVENOUS | Status: DC

## 2019-07-08 MED ORDER — METRONIDAZOLE 500 MG PO TABS: 500.0000 mg | ORAL_TABLET | Freq: Two times a day (BID) | ORAL | 0 refills | Status: AC

## 2019-07-08 MED ORDER — ONDANSETRON 4 MG PO TBDP: 4.0000 mg | ORAL_TABLET | Freq: Three times a day (TID) | ORAL | 0 refills | Status: DC | PRN

## 2019-07-08 MED ORDER — KETOROLAC TROMETHAMINE 60 MG/2ML IM SOLN
60.0000 mg | Freq: Once | INTRAMUSCULAR | Status: AC
  Administered 2019-07-08: 60 mg via INTRAMUSCULAR
  Filled 2019-07-08: qty 2

## 2019-07-08 MED ORDER — LIDOCAINE HCL (PF) 1 % IJ SOLN
INTRAMUSCULAR | Status: AC
  Filled 2019-07-08: qty 5

## 2019-07-08 MED ORDER — CEFTRIAXONE SODIUM 250 MG IJ SOLR
250.0000 mg | Freq: Once | INTRAMUSCULAR | Status: AC
  Administered 2019-07-08: 250 mg via INTRAMUSCULAR
  Filled 2019-07-08: qty 250

## 2019-07-08 MED ORDER — IBUPROFEN 600 MG PO TABS: 600.0000 mg | ORAL_TABLET | Freq: Three times a day (TID) | ORAL | 0 refills | Status: DC | PRN

## 2019-07-08 MED ORDER — DOXYCYCLINE HYCLATE 100 MG PO TABS
100.0000 mg | ORAL_TABLET | Freq: Once | ORAL | Status: AC
  Administered 2019-07-08: 100 mg via ORAL
  Filled 2019-07-08: qty 1

## 2019-07-08 NOTE — ED Triage Notes (Addendum)
Pt comes via POV from home with c/o abdominal pain back pain. Patient states this stated about a week ago with bad pressure and has now progressed.  Pt states some nausea. Pt denies any fever, or urinary symptoms. Pt states unsure if pregnant .  Pt states some swelling to belly and pain is central. Pt also states some spotting and lots of discharge that is white. Pt denies any burning or itching.

## 2019-07-08 NOTE — ED Provider Notes (Signed)
[facility] Emergency Department Provider Note  ____________________________________________   First MD Initiated Contact with Patient 07/08/19 1451     (approximate)  I have reviewed the triage vital signs and the nursing notes.   HISTORY  Chief Complaint Abdominal Pain and Back Pain    HPI Sara Mcneil is a 23 y.o. female  Here with pelvic pain. Pt reports that over the past several days, she's had progressively worsening lower pelvic pain and pressure. It began gradually and describes it as a constant, pressure like, aching discomfort in her lowe rpelvic area. It is constant, worse w/ palpation, movement. She has noticed some blood tinged discharged with this as well as mild dyspareunia. Sexually active with one female partner. H/o PID. No h/o ovarian cysts. Last menstrual cycle was 2 weeks ago, normal in duration. No fever, chills. No RUQ pain. No diarrhea or constipation.        Past Medical History:  Diagnosis Date  . Medical history non-contributory     Patient Active Problem List   Diagnosis Date Noted  . Postpartum care following vaginal delivery 02/18/2018    Past Surgical History:  Procedure Laterality Date  . NO PAST SURGERIES      Prior to Admission medications   Medication Sig Start Date End Date Taking? Authorizing Provider  doxycycline (VIBRAMYCIN) 100 MG capsule Take 1 capsule (100 mg total) by mouth 2 (two) times daily for 14 days. 07/08/19 07/22/19  Shaune PollackIsaacs, Reesha Debes, MD  ibuprofen (ADVIL) 600 MG tablet Take 1 tablet (600 mg total) by mouth every 8 (eight) hours as needed for moderate pain. 07/08/19   Shaune PollackIsaacs, Carlyann Placide, MD  metroNIDAZOLE (FLAGYL) 500 MG tablet Take 1 tablet (500 mg total) by mouth 2 (two) times daily for 14 days. 07/08/19 07/22/19  Shaune PollackIsaacs, Jabarie Pop, MD  ondansetron (ZOFRAN ODT) 4 MG disintegrating tablet Take 1 tablet (4 mg total) by mouth every 8 (eight) hours as needed for nausea or vomiting. 07/08/19   Shaune PollackIsaacs,  Zayden Hahne, MD  Prenatal Vit-Fe Fumarate-FA (MULTIVITAMIN-PRENATAL) 27-0.8 MG TABS tablet Take 1 tablet by mouth daily at 12 noon.    [provider]    Allergies Iodine and Sulfa antibiotics  No family history on file.  Social History Social History   Tobacco Use  . Smoking status: Never Smoker  . Smokeless tobacco: Never Used  Substance Use Topics  . Alcohol use: Not Currently  . Drug use: Not Currently    Review of Systems  Review of Systems  Constitutional: Positive for fatigue. Negative for fever.  HENT: Negative for congestion and sore throat.   Eyes: Negative for visual disturbance.  Respiratory: Negative for cough and shortness of breath.   Cardiovascular: Negative for chest pain.  Gastrointestinal: Positive for abdominal pain. Negative for diarrhea, nausea and vomiting.  Genitourinary: Positive for pelvic pain, vaginal bleeding, vaginal discharge and vaginal pain. Negative for flank pain.  Musculoskeletal: Negative for back pain and neck pain.  Skin: Negative for rash and wound.  Neurological: Negative for weakness.  All other systems reviewed and are negative.    ____________________________________________  PHYSICAL EXAM:      VITAL SIGNS: ED Triage Vitals  Enc Vitals Group     BP 07/08/19 1243 125/71     Pulse Rate 07/08/19 1243 88     Resp 07/08/19 1243 18     Temp 07/08/19 1243 98.5 F (36.9 C)     Temp Source 07/08/19 1243 Oral     SpO2 07/08/19 1243 100 %  Weight 07/08/19 1243 120 lb (54.4 kg)     Height 07/08/19 1243 5\' 8"  (1.727 m)     Head Circumference --      Peak Flow --      Pain Score 07/08/19 1304 10     Pain Loc --      Pain Edu? --      Excl. in GC? --      Physical Exam Vitals signs and nursing note reviewed.  Constitutional:      General: She is not in acute distress.    Appearance: She is well-developed.  HENT:     Head: Normocephalic and atraumatic.  Eyes:     Conjunctiva/sclera: Conjunctivae normal.  Neck:      Musculoskeletal: Neck supple.  Cardiovascular:     Rate and Rhythm: Normal rate and regular rhythm.     Heart sounds: Normal heart sounds. No murmur. No friction rub.  Pulmonary:     Effort: Pulmonary effort is normal. No respiratory distress.     Breath sounds: Normal breath sounds. No wheezing or rales.  Abdominal:     General: There is no distension.     Palpations: Abdomen is soft.     Tenderness: There is abdominal tenderness in the suprapubic area.  Genitourinary:    Comments: Moderate yellow-green vaginal discharge with cervical motion tenderness.  No overt adnexal tenderness or fullness.  Uterine tenderness to palpation. Skin:    General: Skin is warm.     Capillary Refill: Capillary refill takes less than 2 seconds.  Neurological:     Mental Status: She is alert and oriented to person, place, and time.     Motor: No abnormal muscle tone.       ____________________________________________   LABS (all labs ordered are listed, but only abnormal results are displayed)  Labs Reviewed  WET PREP, GENITAL - Abnormal; Notable for the following components:      Result Value   WBC, Wet Prep HPF POC MANY (*)    All other components within normal limits  COMPREHENSIVE METABOLIC PANEL - Abnormal; Notable for the following components:   Glucose, Bld 100 (*)    All other components within normal limits  URINALYSIS, COMPLETE (UACMP) WITH MICROSCOPIC - Abnormal; Notable for the following components:   Color, Urine YELLOW (*)    APPearance HAZY (*)    Leukocytes,Ua MODERATE (*)    All other components within normal limits  GC/CHLAMYDIA PROBE AMP  LIPASE, BLOOD  CBC  POCT PREGNANCY, URINE  POC URINE PREG, ED    ____________________________________________  EKG:  ________________________________________  RADIOLOGY All imaging, including plain films, CT scans, and ultrasounds, independently reviewed by me, and interpretations confirmed via formal radiology reads.  ED MD  interpretation:   Ultrasound: Normal u/s, no torsion, no masses  Official radiology report(s): 14/02/20 Pelvic Complete W Transvaginal And Torsion R/o  Result Date: 07/08/2019 CLINICAL DATA:  23 year old female with pelvic pain for 1 week with nausea. Abdominal swelling. Vaginal spotting. LMP 06/24/2019. EXAM: TRANSABDOMINAL AND TRANSVAGINAL ULTRASOUND OF PELVIS DOPPLER ULTRASOUND OF OVARIES TECHNIQUE: Both transabdominal and transvaginal ultrasound examinations of the pelvis were performed. Transabdominal technique was performed for global imaging of the pelvis including uterus, ovaries, adnexal regions, and pelvic cul-de-sac. It was necessary to proceed with endovaginal exam following the transabdominal exam to visualize the endometrium and adnexa. Color and duplex Doppler ultrasound was utilized to evaluate blood flow to the ovaries. COMPARISON:  None. FINDINGS: Uterus Measurements: 6.2 x 4.3 x 5.3 cm =  volume: 74 mL. Retroverted retroflexed uterus is normal in size and configuration, with no uterine fibroids or other myometrial abnormalities. Endometrium Thickness: 7 mm. No endometrial cavity fluid or focal endometrial mass. Right ovary Measurements: 3.0 x 2.4 x 2.3 cm = volume: 8.4 mL. Normal appearance/no adnexal mass. Left ovary Measurements: 3.0 x 2.1 x 2.1 cm = volume: 7.1 mL. Normal appearance/no adnexal mass. Pulsed Doppler evaluation of both ovaries demonstrates normal low-resistance arterial and venous waveforms. Other findings No abnormal free fluid. IMPRESSION: Normal pelvic ultrasound.  No evidence of adnexal torsion. Electronically Signed   By: Ilona Sorrel M.D.   On: 07/08/2019 16:16    ____________________________________________  PROCEDURES   Procedure(s) performed (including Critical Care):  Procedures  ____________________________________________  INITIAL IMPRESSION / MDM / [place] / ED COURSE  As part of my medical decision making, I reviewed the following data  within the [place] notes reviewed and incorporated, Old chart reviewed, Notes from prior ED visits, and [place] Controlled Substance Database       *Sara Mcneil was evaluated in Emergency Department on 07/08/2019 for the symptoms described in the history of present illness. She was evaluated in the context of the global COVID-19 pandemic, which necessitated consideration that the patient might be at risk for infection with the SARS-CoV-2 virus that causes COVID-19. Institutional protocols and algorithms that pertain to the evaluation of patients at risk for COVID-19 are in a state of rapid change based on information released by regulatory bodies including the CDC and federal and state organizations. These policies and algorithms were followed during the patient's care in the ED.  Some ED evaluations and interventions may be delayed as a result of limited staffing during the pandemic.*  Clinical Course as of Jul 07 1636  Wed Jul 07, 6144  4016 23 year old female here with uncomplicated PID.  No signs of systemic illness.  No urinary symptoms.  Ultrasound shows no evidence of TOA, torsion, or abscess.  Safe sex precautions discussed, will treat empirically.  Urine GC/CT sent.  Given history of recurrent and chronic PID, will add on Flagyl for treatment.   [CI]    Clinical Course User Index [CI] Duffy Bruce, MD    Medical Decision Making: As above. Uncomplicated PID. Tx empirically. ____________________________________________  FINAL CLINICAL IMPRESSION(S) / ED DIAGNOSES  Final diagnoses:  Pelvic pain  PID (acute pelvic inflammatory disease)     MEDICATIONS GIVEN DURING THIS VISIT:  Medications  sodium chloride flush (NS) 0.9 % injection 3 mL (has no administration in time range)  lidocaine (PF) (XYLOCAINE) 1 % injection (has no administration in time range)  ketorolac (TORADOL) injection 60 mg (60 mg Intramuscular Given 07/08/19 1606)  cefTRIAXone  (ROCEPHIN) injection 250 mg (250 mg Intramuscular Given 07/08/19 1608)  doxycycline (VIBRA-TABS) tablet 100 mg (100 mg Oral Given 07/08/19 1606)     ED Discharge Orders         Ordered    ibuprofen (ADVIL) 600 MG tablet  Every 8 hours PRN     07/08/19 1624    ondansetron (ZOFRAN ODT) 4 MG disintegrating tablet  Every 8 hours PRN     07/08/19 1624    doxycycline (VIBRAMYCIN) 100 MG capsule  2 times daily     07/08/19 1624    metroNIDAZOLE (FLAGYL) 500 MG tablet  2 times daily     07/08/19 1624           Note:  This document was prepared using Dragon  voice recognition software and may include unintentional dictation errors.   Shaune Pollack, MD 07/08/19 [phone]

## 2019-07-10 LAB — GC/CHLAMYDIA PROBE AMP
Chlamydia trachomatis, NAA: NEGATIVE
Neisseria Gonorrhoeae by PCR: NEGATIVE

## 2019-08-07 NOTE — L&D Delivery Note (Signed)
Delivery Note At 7:26 PM a viable female was delivered via Vaginal, Spontaneous (Presentation: Left Occiput Anterior).  APGAR: 9, 9; weight pending.   Placenta status: Spontaneous, Intact. Cord: 3 vessels with the following complications: None.  Cord pH: n/a  Anesthesia: None Episiotomy:  none Lacerations:  1st degree, vaginal Suture Repair: not repaired as it was hemostatic Est. Blood Loss (mL):  350  Mom to postpartum.  Baby to Couplet care / Skin to Skin.  Called to see patient.  Mom pushed to deliver a viable female infant.  The head followed by shoulders, which delivered without difficulty, and the rest of the body.  A single nuchal cord noted and delivered through, reduced after delivery of body.  Baby to mom's chest.  Cord clamped and cut after > 1 min delay.  Cord blood obtained.  Placenta delivered spontaneously, intact, with a 3-vessel cord.  First degree vaginal laceration hemostatic without repair.  All counts correct.  Hemostasis obtained with IV pitocin and fundal massage. EBL 350 mL.     Thomasene Mohair, MD 03/19/2020, 7:44 PM

## 2019-08-21 ENCOUNTER — Other Ambulatory Visit (HOSPITAL_COMMUNITY)
Admission: RE | Admit: 2019-08-21 | Discharge: 2019-08-21 | Disposition: A | Discharge status: Home / Self Care | Payer: Medicaid Other | Source: Ambulatory Visit | Attending: Obstetrics and Gynecology | Admitting: Obstetrics and Gynecology

## 2019-08-21 ENCOUNTER — Other Ambulatory Visit: Payer: Self-pay

## 2019-08-21 ENCOUNTER — Encounter: Payer: Self-pay | Admitting: Obstetrics and Gynecology

## 2019-08-21 ENCOUNTER — Ambulatory Visit (INDEPENDENT_AMBULATORY_CARE_PROVIDER_SITE_OTHER): Payer: Medicaid Other | Admitting: Obstetrics and Gynecology

## 2019-08-21 VITALS — BP 118/70 | Wt 116.0 lb

## 2019-08-21 DIAGNOSIS — O09211 Supervision of pregnancy with history of pre-term labor, first trimester: Secondary | ICD-10-CM | POA: Diagnosis not present

## 2019-08-21 DIAGNOSIS — Z3A08 8 weeks gestation of pregnancy: Secondary | ICD-10-CM | POA: Diagnosis not present

## 2019-08-21 DIAGNOSIS — O0991 Supervision of high risk pregnancy, unspecified, first trimester: Secondary | ICD-10-CM | POA: Diagnosis not present

## 2019-08-21 DIAGNOSIS — O0990 Supervision of high risk pregnancy, unspecified, unspecified trimester: Secondary | ICD-10-CM | POA: Diagnosis not present

## 2019-08-21 DIAGNOSIS — O09899 Supervision of other high risk pregnancies, unspecified trimester: Secondary | ICD-10-CM | POA: Insufficient documentation

## 2019-08-21 NOTE — Progress Notes (Signed)
New Obstetric Patient H&P   Chief Complaint: "Desires prenatal care"  History of Present Illness: Patient is a 24 y.o. [id number] Not Hispanic or Latino female, LMP 06/20/2019 presents with amenorrhea and positive home pregnancy test. Based on her  LMP, her EDD is Estimated Date of Delivery: 03/26/2020 and her EGA is [redacted]w[redacted]d. Cycles are 5. days, regular, and occur approximately every : 28 days. Her last pap smear was 5 months ago and was ASCUS, HPV+.    She had a urine pregnancy test which was positive 4 or 5 week(s)  ago. Since her LMP she claims she has experienced no issues. She denies vaginal bleeding. Her past medical history is notable for PID. Her prior pregnancies are notable for preterm delivery at 35 weeks for both pregnancies.  She was taking 17OHP for her history of preterm delivery.   Since her LMP, she admits to the use of tobacco products  no She claims she has lost 10 pounds since the start of her pregnancy.  There are cats in the home in the home  no  She admits close contact with children on a regular basis  yes  She has had chicken pox in the past yes She has had Tuberculosis exposures, symptoms, or previously tested positive for TB   no Current or past history of domestic violence. no  Genetic Screening/Teratology Counseling: (Includes patient, baby's father, or anyone in either family with:)   1. Patient's age >/= 51 [facility]  no 2. Thalassemia ([place], [place], Mediterranean, or Asian background): MCV<80  no 3. Neural tube defect (meningomyelocele, spina bifida, anencephaly)  no 4. Congenital heart defect  no  5. Down syndrome  no 6. Tay-Sachs (Jewish, [place]  no 7. Canavan's Disease  no 8. Sickle cell disease or trait (African)  no  9. Hemophilia or other blood disorders  no  10. Muscular dystrophy  no  11. Cystic fibrosis  no  12. Huntington's Chorea  no  13. Mental retardation/autism  no 14. Other inherited genetic or chromosomal disorder  no 15. Maternal  metabolic disorder (DM, PKU, etc)  no 16. Patient or FOB with a child with a birth defect not listed above no  16a. Patient or FOB with a birth defect themselves no 17. Recurrent pregnancy loss, or stillbirth  no  18. Any medications since LMP other than prenatal vitamins (include vitamins, supplements, OTC meds, drugs, alcohol)  Flagyl, ibuprofen, zofran, doxycycline. She stopped taking them after one week due to difficulty breathing.  19. Any other genetic/environmental exposure to discuss  no  Infection History:   1. Lives with someone with TB or TB exposed  no  2. Patient or partner has history of genital herpes  no 3. Rash or viral illness since LMP  no 4. History of STI (GC, CT, HPV, syphilis, HIV)  no 5. History of recent travel :  no  Other pertinent information:  no   Review of Systems:10 point review of systems negative unless otherwise noted in HPI  Past Medical History:  Diagnosis Date  . HPV (human papilloma virus) infection   . PID (acute pelvic inflammatory disease)   . Preterm delivery     Past Surgical History:  Procedure Laterality Date  . NO PAST SURGERIES      Gynecologic History: Patient's last menstrual period was 06/20/2019.  Obstetric History: [id number], s/p two preterm deliveries.   Family History: denies history of gynecologic cancer. See above for more details.    Social History  Socioeconomic History  . Marital status: Single    Spouse name: Not on file  . Number of children: Not on file  . Years of education: Not on file  . Highest education level: Not on file  Occupational History  . Not on file  Tobacco Use  . Smoking status: Never Smoker  . Smokeless tobacco: Never Used  Substance and Sexual Activity  . Alcohol use: Not Currently  . Drug use: Not Currently  . Sexual activity: Yes    Comment: Undecided  Other Topics Concern  . Not on file  Social History Narrative  . Not on file   Social Determinants of Health   Financial  Resource Strain:   . Difficulty of Paying Living Expenses: Not on file  Food Insecurity:   . Worried About Charity fundraiser in the Last Year: Not on file  . Ran Out of Food in the Last Year: Not on file  Transportation Needs:   . Lack of Transportation (Medical): Not on file  . Lack of Transportation (Non-Medical): Not on file  Physical Activity:   . Days of Exercise per Week: Not on file  . Minutes of Exercise per Session: Not on file  Stress:   . Feeling of Stress : Not on file  Social Connections:   . Frequency of Communication with Friends and Family: Not on file  . Frequency of Social Gatherings with Friends and Family: Not on file  . Attends Religious Services: Not on file  . Active Member of Clubs or Organizations: Not on file  . Attends Archivist Meetings: Not on file  . Marital Status: Not on file  Intimate Partner Violence:   . Fear of Current or Ex-Partner: Not on file  . Emotionally Abused: Not on file  . Physically Abused: Not on file  . Sexually Abused: Not on file    Allergies  Allergen Reactions  . Iodine   . Sulfa Antibiotics     Prior to Admission medications: denies     Physical Exam BP 118/70   Wt 116 lb (52.6 kg)   LMP 06/20/2019   BMI 17.64 kg/m   Physical Exam Exam conducted with a chaperone present.  Constitutional:      General: She is not in acute distress.    Appearance: Normal appearance. She is well-developed.  HENT:     Head: Normocephalic and atraumatic.  Eyes:     General: No scleral icterus.    Conjunctiva/sclera: Conjunctivae normal.  Neck:     Thyroid: No thyromegaly.  Cardiovascular:     Rate and Rhythm: Normal rate and regular rhythm.     Heart sounds: Normal heart sounds. No murmur. No friction rub. No gallop.   Pulmonary:     Effort: Pulmonary effort is normal.     Breath sounds: Normal breath sounds. No wheezing.  Abdominal:     General: There is no distension.     Palpations: Abdomen is soft. There is  no mass.     Tenderness: There is no abdominal tenderness. There is no guarding or rebound.     Hernia: No hernia is present. There is no hernia in the left inguinal area.  Genitourinary:    General: Normal vulva.     Exam position: Lithotomy position.     Tanner stage (genital): 5.     Labia:        Right: No rash, tenderness or lesion.        Left: No rash,  tenderness or lesion.      Vagina: Normal.     Cervix: Normal.     Uterus: Normal.      Adnexa: Right adnexa normal and left adnexa normal.  Musculoskeletal:        General: Normal range of motion.     Cervical back: Normal range of motion and neck supple.  Skin:    General: Skin is warm and dry.     Findings: No rash.  Neurological:     Mental Status: She is alert and oriented to person, place, and time.  Psychiatric:        Mood and Affect: Mood normal.        Behavior: Behavior normal.        Judgment: Judgment normal.      Female Chaperone present during breast and/or pelvic exam.   Assessment: 24 y.o. [id number] at [redacted]w[redacted]d presenting to initiate prenatal care  Plan: 1) Avoid alcoholic beverages. 2) Patient encouraged not to smoke.  3) Discontinue the use of all non-medicinal drugs and chemicals.  4) Take prenatal vitamins daily.  5) Nutrition, food safety (fish, cheese advisories, and high nitrite foods) and exercise discussed. 6) Hospital and practice style discussed with cross coverage system.  7) Genetic Screening, such as with 1st Trimester Screening, cell free fetal DNA, AFP testing, and Ultrasound, as well as with amniocentesis and CVS as appropriate, is discussed with patient. At the conclusion of today's visit patient undecided genetic testing 8) Patient is asked about travel to areas at risk for the [place] virus, and counseled to avoid travel and exposure to mosquitoes or sexual partners who may have themselves been exposed to the virus. Testing is discussed, and will be ordered as appropriate.   Thomasene Mohair, MD 08/21/2019 3:03 PM

## 2019-08-22 LAB — URINE DRUG PANEL 7
Amphetamines, Urine: NEGATIVE ng/mL
Barbiturate Quant, Ur: NEGATIVE ng/mL
Benzodiazepine Quant, Ur: NEGATIVE ng/mL
Cannabinoid Quant, Ur: NEGATIVE ng/mL
Cocaine (Metab.): NEGATIVE ng/mL
Opiate Quant, Ur: NEGATIVE ng/mL
PCP Quant, Ur: NEGATIVE ng/mL

## 2019-08-23 LAB — URINE CULTURE: Organism ID, Bacteria: NO GROWTH

## 2019-08-24 ENCOUNTER — Ambulatory Visit (INDEPENDENT_AMBULATORY_CARE_PROVIDER_SITE_OTHER): Payer: Medicaid Other

## 2019-08-24 ENCOUNTER — Other Ambulatory Visit: Payer: Self-pay

## 2019-08-24 ENCOUNTER — Ambulatory Visit (INDEPENDENT_AMBULATORY_CARE_PROVIDER_SITE_OTHER): Payer: Medicaid Other | Admitting: Obstetrics and Gynecology

## 2019-08-24 VITALS — BP 90/60 | Wt 117.0 lb

## 2019-08-24 DIAGNOSIS — N8312 Corpus luteum cyst of left ovary: Secondary | ICD-10-CM | POA: Diagnosis not present

## 2019-08-24 DIAGNOSIS — Z3A08 8 weeks gestation of pregnancy: Secondary | ICD-10-CM | POA: Diagnosis not present

## 2019-08-24 DIAGNOSIS — O3481 Maternal care for other abnormalities of pelvic organs, first trimester: Secondary | ICD-10-CM

## 2019-08-24 DIAGNOSIS — O09211 Supervision of pregnancy with history of pre-term labor, first trimester: Secondary | ICD-10-CM

## 2019-08-24 DIAGNOSIS — Z3A09 9 weeks gestation of pregnancy: Secondary | ICD-10-CM

## 2019-08-24 DIAGNOSIS — O09899 Supervision of other high risk pregnancies, unspecified trimester: Secondary | ICD-10-CM

## 2019-08-24 DIAGNOSIS — O0990 Supervision of high risk pregnancy, unspecified, unspecified trimester: Secondary | ICD-10-CM

## 2019-08-24 DIAGNOSIS — O0991 Supervision of high risk pregnancy, unspecified, first trimester: Secondary | ICD-10-CM

## 2019-08-24 LAB — RPR+RH+ABO+RUB AB+AB SCR+CB...
Antibody Screen: NEGATIVE
HIV Screen 4th Generation wRfx: NONREACTIVE
Hematocrit: 36.6 % (ref 34.0–46.6)
Hemoglobin: 12.1 g/dL (ref 11.1–15.9)
Hepatitis B Surface Ag: NEGATIVE
MCH: 30.4 pg (ref 26.6–33.0)
MCHC: 33.1 g/dL (ref 31.5–35.7)
MCV: 92 fL (ref 79–97)
Platelets: 276 10*3/uL (ref 150–450)
RBC: 3.98 x10E6/uL (ref 3.77–5.28)
RDW: 12 % (ref 11.7–15.4)
RPR Ser Ql: NONREACTIVE
Rh Factor: POSITIVE
Rubella Antibodies, IGG: 1.09 index (ref >0.99)
Varicella zoster IgG: <135 index — ABNORMAL LOW (ref >165)
WBC: 6 10*3/uL (ref 3.4–10.8)

## 2019-08-24 NOTE — Progress Notes (Signed)
Routine Prenatal Care Visit  Subjective  Sara Mcneil is a 24 y.o. [id number] at [redacted]w[redacted]d being seen today for ongoing prenatal care.  She is currently monitored for the following issues for this high-risk pregnancy and has Postpartum care following vaginal delivery; Supervision of high risk pregnancy, antepartum; and History of preterm delivery, currently pregnant on their problem list.  ----------------------------------------------------------------------------------- Patient reports no complaints.    . Vag. Bleeding: None.   . Leaking Fluid denies.  U/S confirms EDD ----------------------------------------------------------------------------------- The following portions of the patient's history were reviewed and updated as appropriate: allergies, current medications, past family history, past medical history, past social history, past surgical history and problem list. Problem list updated.  Objective  Blood pressure 90/60, weight 117 lb (53.1 kg), last menstrual period 06/20/2019, unknown if currently breastfeeding. Pregravid weight 116 lb (52.6 kg) Total Weight Gain 1 lb (0.454 kg) Urinalysis: Urine Protein    Urine Glucose    Fetal Status: Fetal Heart Rate (bpm): 184         General:  Alert, oriented and cooperative. Patient is in no acute distress.  Skin: Skin is warm and dry. No rash noted.   Cardiovascular: Normal heart rate noted  Respiratory: Normal respiratory effort, no problems with respiration noted  Abdomen: Soft, gravid, appropriate for gestational age.       Pelvic:  Cervical exam deferred        Extremities: Normal range of motion.     Mental Status: Normal mood and affect. Normal behavior. Normal judgment and thought content.   Imaging Results US OB Comp Less 14 Wks  Result Date: 08/24/2019 Patient Name: Sara Mcneil DOB: [birth year] MRN: [id number] ULTRASOUND REPORT Location: [facility] Date of Service: 08/24/2019 Indications:dating/viability Findings:  Mason Jim intrauterine pregnancy is visualized with a CRL consistent with [redacted]w[redacted]d gestation, giving an (U/S) EDD of 03/29/2020. The (U/S) EDD is consistent with the clinically established EDD of 03/26/2020. FHR: 182 BPM CRL measurement: 22.1 mm Yolk sac is visualized and appears normal. Amnion: visualized and appears normal Right Ovary is normal in appearance. Left Ovary is normal appearance. Corpus luteal cyst:  Left ovary Survey of the adnexa demonstrates no adnexal masses. There is no free peritoneal fluid in the cul de sac. Impression: 1. 8wd Viable Singleton Intrauterine pregnancy by U/S. 2. (U/S) EDD is consistent with Clinically established EDD of 03/26/2020. Deanna Artis, RT There is a viable singleton gestation.  Detailed evaluation of the fetal anatomy is precluded by early gestational age.  It must be noted that a normal ultrasound particular at this early gestational age is unable to rule out fetal aneuploidy, risk of first trimester miscarriage, or anatomic birth defects. Thomasene Mohair, MD, [facility], [facility] 08/24/2019 2:27 PM      Assessment   23 y.o. [id number] at [redacted]w[redacted]d by  03/26/2020, by Last Menstrual Period presenting for routine prenatal visit  Plan   pregnancy Problems (from 08/21/19 to present)    Problem Noted Resolved   Supervision of high risk pregnancy, antepartum 08/21/2019 by Conard Novak, MD No   History of preterm delivery, currently pregnant 08/21/2019 by Conard Novak, MD No       Preterm labor symptoms and general obstetric precautions including but not limited to vaginal bleeding, contractions, leaking of fluid and fetal movement were reviewed in detail with the patient. Please refer to After Visit Summary for other counseling recommendations.   - Declines genetic screening  Return in about 4 weeks (around 09/21/2019)  for Routine Prenatal Appointment.  Prentice Docker, MD, [facility], [place]  [facility] 08/24/2019 5:08 PM

## 2019-08-25 LAB — CERVICOVAGINAL ANCILLARY ONLY
Chlamydia: NEGATIVE
Comment: NEGATIVE
Comment: NORMAL
Neisseria Gonorrhea: NEGATIVE

## 2019-09-25 ENCOUNTER — Other Ambulatory Visit: Payer: Self-pay

## 2019-09-25 ENCOUNTER — Ambulatory Visit (INDEPENDENT_AMBULATORY_CARE_PROVIDER_SITE_OTHER): Payer: Medicaid Other | Admitting: Certified Nurse Midwife

## 2019-09-25 VITALS — BP 86/60 | Temp 97.5°F | Wt 119.0 lb

## 2019-09-25 DIAGNOSIS — M5489 Other dorsalgia: Secondary | ICD-10-CM

## 2019-09-25 DIAGNOSIS — R519 Headache, unspecified: Secondary | ICD-10-CM

## 2019-09-25 DIAGNOSIS — Z3A13 13 weeks gestation of pregnancy: Secondary | ICD-10-CM

## 2019-09-25 DIAGNOSIS — O09892 Supervision of other high risk pregnancies, second trimester: Secondary | ICD-10-CM

## 2019-09-25 DIAGNOSIS — O0990 Supervision of high risk pregnancy, unspecified, unspecified trimester: Secondary | ICD-10-CM

## 2019-09-25 DIAGNOSIS — R1030 Lower abdominal pain, unspecified: Secondary | ICD-10-CM

## 2019-09-25 DIAGNOSIS — O09899 Supervision of other high risk pregnancies, unspecified trimester: Secondary | ICD-10-CM

## 2019-09-25 LAB — POCT URINALYSIS DIPSTICK OB
Glucose, UA: NEGATIVE
POC,PROTEIN,UA: NEGATIVE

## 2019-09-25 MED ORDER — CLINDAMYCIN HCL 300 MG PO CAPS: 300.0000 mg | ORAL_CAPSULE | Freq: Two times a day (BID) | ORAL | 0 refills | Status: AC

## 2019-09-25 NOTE — Progress Notes (Signed)
C/o really bad major H/As; sharp pain in lower abd that shoots to back and sometimes downwards.rj

## 2019-09-26 NOTE — Progress Notes (Signed)
HROB at 13wk6d: Hx of PTD x 2, both at 35 weeks. Declines 17p injections. Complains of lower abdominal pressure/pain that radiated into vaginal area.  Has also noticed increased discharge. No vulvar itching or irritation. FHTs WNL FH at 1/2 between U and SP. Mild tenderness to palpation suprapubically Pelvic exam: Vulva: no lesions or inflammation Vagina: thin white discharge Cervix: multip/ closed/posterior Wet prep: positive for clue cells and negative for Trich and hyphae  A: IUP at 13wk6d with bacterial vaginosis Pelvic pain with growing uterus Hx of preterm labor and delivery x 2  P: Refused Flagyl due to intolerance in the past with N/V Clindamycin 300 mgm BID x 7 days RTO in 4 weeks and prn\ Declines 17 P and genetic testing  Farrel Conners, CNM

## 2019-09-27 ENCOUNTER — Encounter: Payer: Self-pay | Admitting: Certified Nurse Midwife

## 2019-10-23 ENCOUNTER — Other Ambulatory Visit: Payer: Self-pay

## 2019-10-23 ENCOUNTER — Encounter: Payer: Self-pay | Admitting: Certified Nurse Midwife

## 2019-10-23 ENCOUNTER — Ambulatory Visit (INDEPENDENT_AMBULATORY_CARE_PROVIDER_SITE_OTHER): Payer: Medicaid Other

## 2019-10-23 ENCOUNTER — Ambulatory Visit (INDEPENDENT_AMBULATORY_CARE_PROVIDER_SITE_OTHER): Payer: Medicaid Other | Admitting: Certified Nurse Midwife

## 2019-10-23 VITALS — BP 100/60 | Wt 127.0 lb

## 2019-10-23 DIAGNOSIS — Z3A13 13 weeks gestation of pregnancy: Secondary | ICD-10-CM

## 2019-10-23 DIAGNOSIS — O0992 Supervision of high risk pregnancy, unspecified, second trimester: Secondary | ICD-10-CM

## 2019-10-23 DIAGNOSIS — Z3A17 17 weeks gestation of pregnancy: Secondary | ICD-10-CM

## 2019-10-23 DIAGNOSIS — O09212 Supervision of pregnancy with history of pre-term labor, second trimester: Secondary | ICD-10-CM

## 2019-10-23 DIAGNOSIS — O09899 Supervision of other high risk pregnancies, unspecified trimester: Secondary | ICD-10-CM

## 2019-10-23 DIAGNOSIS — O0990 Supervision of high risk pregnancy, unspecified, unspecified trimester: Secondary | ICD-10-CM

## 2019-10-23 DIAGNOSIS — Z3686 Encounter for antenatal screening for cervical length: Secondary | ICD-10-CM

## 2019-10-23 DIAGNOSIS — Z363 Encounter for antenatal screening for malformations: Secondary | ICD-10-CM

## 2019-10-23 NOTE — Progress Notes (Signed)
ROB at 17wk6d: Doing well. ?feeling fetal movement. Tolerated antibiotics for BV OK. FH at U-1FB FHT 143. Gained 8# since last visit 4 weeks ago.  Cervix: 4.5cm in length with no funneling  ROB and anatomy scan in 2 weeks.

## 2019-10-23 NOTE — Progress Notes (Signed)
No concerns.rj 

## 2019-11-04 ENCOUNTER — Other Ambulatory Visit: Payer: Self-pay

## 2019-11-04 ENCOUNTER — Ambulatory Visit (INDEPENDENT_AMBULATORY_CARE_PROVIDER_SITE_OTHER): Payer: Medicaid Other | Admitting: Obstetrics and Gynecology

## 2019-11-04 ENCOUNTER — Ambulatory Visit (INDEPENDENT_AMBULATORY_CARE_PROVIDER_SITE_OTHER): Payer: Medicaid Other

## 2019-11-04 ENCOUNTER — Encounter: Payer: Self-pay | Admitting: Obstetrics and Gynecology

## 2019-11-04 VITALS — BP 104/62 | Wt 129.0 lb

## 2019-11-04 DIAGNOSIS — Z3A19 19 weeks gestation of pregnancy: Secondary | ICD-10-CM

## 2019-11-04 DIAGNOSIS — O4442 Low lying placenta NOS or without hemorrhage, second trimester: Secondary | ICD-10-CM | POA: Diagnosis not present

## 2019-11-04 DIAGNOSIS — O09212 Supervision of pregnancy with history of pre-term labor, second trimester: Secondary | ICD-10-CM

## 2019-11-04 DIAGNOSIS — Z363 Encounter for antenatal screening for malformations: Secondary | ICD-10-CM | POA: Diagnosis not present

## 2019-11-04 DIAGNOSIS — O0992 Supervision of high risk pregnancy, unspecified, second trimester: Secondary | ICD-10-CM

## 2019-11-04 DIAGNOSIS — Z3A20 20 weeks gestation of pregnancy: Secondary | ICD-10-CM

## 2019-11-04 DIAGNOSIS — O0990 Supervision of high risk pregnancy, unspecified, unspecified trimester: Secondary | ICD-10-CM

## 2019-11-04 NOTE — Progress Notes (Signed)
    Routine Prenatal Care Visit  Subjective  Sara Mcneil is a 24 y.o. [id number] at [redacted]w[redacted]d being seen today for ongoing prenatal care.  She is currently monitored for the following issues for this low-risk pregnancy and has Supervision of high risk pregnancy, antepartum and History of preterm delivery, currently pregnant on their problem list.  ----------------------------------------------------------------------------------- Patient reports fatigue. Sleeping 6-7 hours a night, works full time and has two small children. Contractions: Not present. Vag. Bleeding: None.  Movement: Present. Denies leaking of fluid.  ----------------------------------------------------------------------------------- The following portions of the patient's history were reviewed and updated as appropriate: allergies, current medications, past family history, past medical history, past social history, past surgical history and problem list. Problem list updated.   Objective  Blood pressure 104/62, weight 129 lb (58.5 kg), last menstrual period 06/20/2019, unknown if currently breastfeeding. Pregravid weight 116 lb (52.6 kg) Total Weight Gain 13 lb (5.897 kg) Urinalysis:      Fetal Status: Fetal Heart Rate (bpm): 160   Movement: Present     General:  Alert, oriented and cooperative. Patient is in no acute distress.  Skin: Skin is warm and dry. No rash noted.   Cardiovascular: Normal heart rate noted  Respiratory: Normal respiratory effort, no problems with respiration noted  Abdomen: Soft, gravid, appropriate for gestational age. Pain/Pressure: Absent     Pelvic:  Cervical exam deferred        Extremities: Normal range of motion.  Edema: None  Mental Status: Normal mood and affect. Normal behavior. Normal judgment and thought content.     Assessment   24 y.o. [id number] at [redacted]w[redacted]d by  03/26/2020, by Last Menstrual Period presenting for routine prenatal visit  Plan   pregnancy Problems (from 08/21/19 to  present)    Problem Noted Resolved   Supervision of high risk pregnancy, antepartum 08/21/2019 by Conard Novak, MD No   Overview Addendum 11/04/2019  4:04 PM by Natale Milch, MD    [facility]inic [facility] Prenatal Labs  Dating LMP Blood type: O/Positive/-- (01/15 1552)   Genetic Screen declines Antibody:Negative (01/15 1552)  Anatomic [place] Complete [ ]  low lying placenta  Rubella: 1.09 (01/15 1552) Varicella: Non immune  GTT Third trimester:  RPR: Non Reactive (01/15 1552)   Rhogam  not needed HBsAg: Negative (01/15 1552)   TDaP vaccine                       Flu Shot: HIV: Non Reactive (01/15 1552)   Baby Food    Bottle                            GBS:   Contraception  Tubal ligation Pap: ASCUS HPV+, 2020  CBB     CS/VBAC    Support Person           History of preterm delivery, currently pregnant 08/21/2019 by 08/23/2019, MD No      Discussed low lying placenta. Follow up Conard Novak in 2 months Discussed fatigue. Hx anemia. Discussed iron supplementation and iron rich food. HGB normal 2 months ago.   Gestational age appropriate obstetric precautions including but not limited to vaginal bleeding, contractions, leaking of fluid and fetal movement were reviewed in detail with the patient.    Return in about 4 weeks (around 12/02/2019) for ROB in person.  12/04/2019 MD [facility], [facility] 11/04/2019, 4:06 PM

## 2019-11-04 NOTE — Progress Notes (Signed)
ROB No concerns Denies lof, no vb, No FM yet  

## 2019-11-04 NOTE — Patient Instructions (Signed)
Second Trimester of Pregnancy The second trimester is from week 14 through week 27 (months 4 through 6). The second trimester is often a time when you feel your best. Your body has adjusted to being pregnant, and you begin to feel better physically. Usually, morning sickness has lessened or quit completely, you may have more energy, and you may have an increase in appetite. The second trimester is also a time when the fetus is growing rapidly. At the end of the sixth month, the fetus is about 9 inches long and weighs about 1 pounds. You will likely begin to feel the baby move (quickening) between 16 and 20 weeks of pregnancy. Body changes during your second trimester Your body continues to go through many changes during your second trimester. The changes vary from woman to woman.  Your weight will continue to increase. You will notice your lower abdomen bulging out.  You may begin to get stretch marks on your hips, abdomen, and breasts.  You may develop headaches that can be relieved by medicines. The medicines should be approved by your health care provider.  You may urinate more often because the fetus is pressing on your bladder.  You may develop or continue to have heartburn as a result of your pregnancy.  You may develop constipation because certain hormones are causing the muscles that push waste through your intestines to slow down.  You may develop hemorrhoids or swollen, bulging veins (varicose veins).  You may have back pain. This is caused by: ? Weight gain. ? Pregnancy hormones that are relaxing the joints in your pelvis. ? A shift in weight and the muscles that support your balance.  Your breasts will continue to grow and they will continue to become tender.  Your gums may bleed and may be sensitive to brushing and flossing.  Dark spots or blotches (chloasma, mask of pregnancy) may develop on your face. This will likely fade after the baby is born.  A dark line from your  belly button to the pubic area (linea nigra) may appear. This will likely fade after the baby is born.  You may have changes in your hair. These can include thickening of your hair, rapid growth, and changes in texture. Some women also have hair loss during or after pregnancy, or hair that feels dry or thin. Your hair will most likely return to normal after your baby is born. What to expect at prenatal visits During a routine prenatal visit:  You will be weighed to make sure you and the fetus are growing normally.  Your blood pressure will be taken.  Your abdomen will be measured to track your baby's growth.  The fetal heartbeat will be listened to.  Any test results from the previous visit will be discussed. Your health care provider may ask you:  How you are feeling.  If you are feeling the baby move.  If you have had any abnormal symptoms, such as leaking fluid, bleeding, severe headaches, or abdominal cramping.  If you are using any tobacco products, including cigarettes, chewing tobacco, and electronic cigarettes.  If you have any questions. Other tests that may be performed during your second trimester include:  Blood tests that check for: ? Low iron levels (anemia). ? High blood sugar that affects pregnant women (gestational diabetes) between 24 and 28 weeks. ? Rh antibodies. This is to check for a protein on red blood cells (Rh factor).  Urine tests to check for infections, diabetes, or protein in the   urine.  An ultrasound to confirm the proper growth and development of the baby.  An amniocentesis to check for possible genetic problems.  Fetal screens for spina bifida and Down syndrome.  HIV (human immunodeficiency virus) testing. Routine prenatal testing includes screening for HIV, unless you choose not to have this test. Follow these instructions at home: Medicines  Follow your health care provider's instructions regarding medicine use. Specific medicines may be  either safe or unsafe to take during pregnancy.  Take a prenatal vitamin that contains at least 600 micrograms (mcg) of folic acid.  If you develop constipation, try taking a stool softener if your health care provider approves. Eating and drinking   Eat a balanced diet that includes fresh fruits and vegetables, whole grains, good sources of protein such as meat, eggs, or tofu, and low-fat dairy. Your health care provider will help you determine the amount of weight gain that is right for you.  Avoid raw meat and uncooked cheese. These carry germs that can cause birth defects in the baby.  If you have low calcium intake from food, talk to your health care provider about whether you should take a daily calcium supplement.  Limit foods that are high in fat and processed sugars, such as fried and sweet foods.  To prevent constipation: ? Drink enough fluid to keep your urine clear or pale yellow. ? Eat foods that are high in fiber, such as fresh fruits and vegetables, whole grains, and beans. Activity  Exercise only as directed by your health care provider. Most women can continue their usual exercise routine during pregnancy. Try to exercise for 30 minutes at least 5 days a week. Stop exercising if you experience uterine contractions.  Avoid heavy lifting, wear low heel shoes, and practice good posture.  A sexual relationship may be continued unless your health care provider directs you otherwise. Relieving pain and discomfort  Wear a good support bra to prevent discomfort from breast tenderness.  Take warm sitz baths to soothe any pain or discomfort caused by hemorrhoids. Use hemorrhoid cream if your health care provider approves.  Rest with your legs elevated if you have leg cramps or low back pain.  If you develop varicose veins, wear support hose. Elevate your feet for 15 minutes, 3-4 times a day. Limit salt in your diet. Prenatal Care  Write down your questions. Take them to  your prenatal visits.  Keep all your prenatal visits as told by your health care provider. This is important. Safety  Wear your seat belt at all times when driving.  Make a list of emergency phone numbers, including numbers for family, friends, the hospital, and police and fire departments. General instructions  Ask your health care provider for a referral to a local prenatal education class. Begin classes no later than the beginning of month 6 of your pregnancy.  Ask for help if you have counseling or nutritional needs during pregnancy. Your health care provider can offer advice or refer you to specialists for help with various needs.  Do not use hot tubs, steam rooms, or saunas.  Do not douche or use tampons or scented sanitary pads.  Do not cross your legs for long periods of time.  Avoid cat litter boxes and soil used by cats. These carry germs that can cause birth defects in the baby and possibly loss of the fetus by miscarriage or stillbirth.  Avoid all smoking, herbs, alcohol, and unprescribed drugs. Chemicals in these products can affect the formation   and growth of the baby.  Do not use any products that contain nicotine or tobacco, such as cigarettes and e-cigarettes. If you need help quitting, ask your health care provider.  Visit your dentist if you have not gone yet during your pregnancy. Use a soft toothbrush to brush your teeth and be gentle when you floss. Contact a health care provider if:  You have dizziness.  You have mild pelvic cramps, pelvic pressure, or nagging pain in the abdominal area.  You have persistent nausea, vomiting, or diarrhea.  You have a bad smelling vaginal discharge.  You have pain when you urinate. Get help right away if:  You have a fever.  You are leaking fluid from your vagina.  You have spotting or bleeding from your vagina.  You have severe abdominal cramping or pain.  You have rapid weight gain or weight loss.  You have  shortness of breath with chest pain.  You notice sudden or extreme swelling of your face, hands, ankles, feet, or legs.  You have not felt your baby move in over an hour.  You have severe headaches that do not go away when you take medicine.  You have vision changes. Summary  The second trimester is from week 14 through week 27 (months 4 through 6). It is also a time when the fetus is growing rapidly.  Your body goes through many changes during pregnancy. The changes vary from woman to woman.  Avoid all smoking, herbs, alcohol, and unprescribed drugs. These chemicals affect the formation and growth your baby.  Do not use any tobacco products, such as cigarettes, chewing tobacco, and e-cigarettes. If you need help quitting, ask your health care provider.  Contact your health care provider if you have any questions. Keep all prenatal visits as told by your health care provider. This is important. This information is not intended to replace advice given to you by your health care provider. Make sure you discuss any questions you have with your health care provider. Document Revised: 11/14/2018 Document Reviewed: 08/28/2016 Elsevier Patient Education  [place]. Iron-Rich Diet  Iron is a mineral that helps your body to produce hemoglobin. Hemoglobin is a protein in red blood cells that carries oxygen to your body's tissues. Eating too little iron may cause you to feel weak and tired, and it can increase your risk of infection. Iron is naturally found in many foods, and many foods have iron added to them (iron-fortified foods). You may need to follow an iron-rich diet if you do not have enough iron in your body due to certain medical conditions. The amount of iron that you need each day depends on your age, your sex, and any medical conditions you have. Follow instructions from your health care provider or a diet and nutrition specialist (dietitian) about how much iron you should eat  each day. What are tips for following this plan? Reading food labels  Check food labels to see how many milligrams (mg) of iron are in each serving. Cooking  Cook foods in pots and pans that are made from iron.  Take these steps to make it easier for your body to absorb iron from certain foods: ? Soak beans overnight before cooking. ? Soak whole grains overnight and drain them before using. ? Ferment flours before baking, such as by using yeast in bread dough. Meal planning  When you eat foods that contain iron, you should eat them with foods that are high in vitamin C. These  include oranges, peppers, tomatoes, potatoes, and mango. Vitamin C helps your body to absorb iron. General information  Take iron supplements only as told by your health care provider. An overdose of iron can be life-threatening. If you were prescribed iron supplements, take them with orange juice or a vitamin C supplement.  When you eat iron-fortified foods or take an iron supplement, you should also eat foods that naturally contain iron, such as meat, poultry, and fish. Eating naturally iron-rich foods helps your body to absorb the iron that is added to other foods or contained in a supplement.  Certain foods and drinks prevent your body from absorbing iron properly. Avoid eating these foods in the same meal as iron-rich foods or with iron supplements. These foods include: ? Coffee, black tea, and red wine. ? Milk, dairy products, and foods that are high in calcium. ? Beans and soybeans. ? Whole grains. What foods should I eat? Fruits Prunes. Raisins. Eat fruits high in vitamin C, such as oranges, grapefruits, and strawberries, alongside iron-rich foods. Vegetables Spinach (cooked). Green peas. Broccoli. Fermented vegetables. Eat vegetables high in vitamin C, such as leafy greens, potatoes, bell peppers, and tomatoes, alongside iron-rich foods. Grains Iron-fortified breakfast cereal. Iron-fortified  whole-wheat bread. Enriched rice. Sprouted grains. Meats and other proteins Beef liver. Oysters. Beef. Shrimp. [place]. Chicken. Tuna. Sardines. Chickpeas. Nuts. Tofu. Pumpkin seeds. Beverages Tomato juice. Fresh orange juice. Prune juice. Hibiscus tea. Fortified instant breakfast shakes. Sweets and desserts Blackstrap molasses. Seasonings and condiments Tahini. Fermented soy sauce. Other foods Wheat germ. The items listed above may not be a complete list of recommended foods and beverages. Contact a dietitian for more information. What foods should I avoid? Grains Whole grains. Bran cereal. Bran flour. Oats. Meats and other proteins Soybeans. Products made from soy protein. Black beans. Lentils. Mung beans. Split peas. Dairy Milk. Cream. Cheese. Yogurt. Cottage cheese. Beverages Coffee. Black tea. Red wine. Sweets and desserts Cocoa. Chocolate. Ice cream. Other foods Basil. Oregano. Large amounts of parsley. The items listed above may not be a complete list of foods and beverages to avoid. Contact a dietitian for more information. Summary  Iron is a mineral that helps your body to produce hemoglobin. Hemoglobin is a protein in red blood cells that carries oxygen to your body's tissues.  Iron is naturally found in many foods, and many foods have iron added to them (iron-fortified foods).  When you eat foods that contain iron, you should eat them with foods that are high in vitamin C. Vitamin C helps your body to absorb iron.  Certain foods and drinks prevent your body from absorbing iron properly, such as whole grains and dairy products. You should avoid eating these foods in the same meal as iron-rich foods or with iron supplements. This information is not intended to replace advice given to you by your health care provider. Make sure you discuss any questions you have with your health care provider. Document Revised: 07/05/2017 Document Reviewed: 06/18/2017 Elsevier Patient  Education  2020 [employer].

## 2019-11-05 ENCOUNTER — Encounter: Payer: Self-pay | Admitting: *Deleted

## 2019-11-05 ENCOUNTER — Observation Stay
Admission: EM | Admit: 2019-11-05 | Discharge: 2019-11-05 | Disposition: A | Discharge status: Home / Self Care | Payer: Medicaid Other | Attending: Obstetrics and Gynecology | Admitting: Obstetrics and Gynecology

## 2019-11-05 ENCOUNTER — Other Ambulatory Visit: Payer: Self-pay

## 2019-11-05 DIAGNOSIS — O0990 Supervision of high risk pregnancy, unspecified, unspecified trimester: Secondary | ICD-10-CM

## 2019-11-05 DIAGNOSIS — O9A212 Injury, poisoning and certain other consequences of external causes complicating pregnancy, second trimester: Secondary | ICD-10-CM | POA: Diagnosis not present

## 2019-11-05 DIAGNOSIS — O368120 Decreased fetal movements, second trimester, not applicable or unspecified: Secondary | ICD-10-CM

## 2019-11-05 DIAGNOSIS — S3991XA Unspecified injury of abdomen, initial encounter: Secondary | ICD-10-CM | POA: Insufficient documentation

## 2019-11-05 DIAGNOSIS — W501XXA Accidental kick by another person, initial encounter: Secondary | ICD-10-CM | POA: Diagnosis not present

## 2019-11-05 DIAGNOSIS — Z3A19 19 weeks gestation of pregnancy: Secondary | ICD-10-CM | POA: Diagnosis not present

## 2019-11-05 DIAGNOSIS — O09899 Supervision of other high risk pregnancies, unspecified trimester: Secondary | ICD-10-CM

## 2019-11-05 LAB — COMPREHENSIVE METABOLIC PANEL
ALT: 9 U/L (ref 0–44)
AST: 13 U/L — ABNORMAL LOW (ref 15–41)
Albumin: 3.5 g/dL (ref 3.5–5.0)
Alkaline Phosphatase: 53 U/L (ref 38–126)
Anion gap: 7 (ref 5–15)
BUN: 11 mg/dL (ref 6–20)
CO2: 27 mmol/L (ref 22–32)
Calcium: 9.2 mg/dL (ref 8.9–10.3)
Chloride: 105 mmol/L (ref 98–111)
Creatinine, Ser: 0.72 mg/dL (ref 0.44–1.00)
GFR calc Af Amer: >60 mL/min (ref >60)
GFR calc non Af Amer: >60 mL/min (ref >60)
Glucose, Bld: 97 mg/dL (ref 70–99)
Potassium: 3.8 mmol/L (ref 3.5–5.1)
Sodium: 139 mmol/L (ref 135–145)
Total Bilirubin: 0.5 mg/dL (ref 0.3–1.2)
Total Protein: 6.8 g/dL (ref 6.5–8.1)

## 2019-11-05 LAB — URINALYSIS, COMPLETE (UACMP) WITH MICROSCOPIC
Bilirubin Urine: NEGATIVE
Glucose, UA: NEGATIVE mg/dL
Hgb urine dipstick: NEGATIVE
Ketones, ur: NEGATIVE mg/dL
Nitrite: NEGATIVE
Protein, ur: NEGATIVE mg/dL
Specific Gravity, Urine: 1.011 (ref 1.005–1.030)
pH: 7 (ref 5.0–8.0)

## 2019-11-05 LAB — CBC
HCT: 35.1 % — ABNORMAL LOW (ref 36.0–46.0)
Hemoglobin: 12.2 g/dL (ref 12.0–15.0)
MCH: 32.4 pg (ref 26.0–34.0)
MCHC: 34.8 g/dL (ref 30.0–36.0)
MCV: 93.4 fL (ref 80.0–100.0)
Platelets: 236 10*3/uL (ref 150–400)
RBC: 3.76 MIL/uL — ABNORMAL LOW (ref 3.87–5.11)
RDW: 13.3 % (ref 11.5–15.5)
WBC: 8.1 10*3/uL (ref 4.0–10.5)
nRBC: 0 % (ref 0.0–0.2)

## 2019-11-05 LAB — HCG, QUANTITATIVE, PREGNANCY: hCG, Beta Chain, Quant, S: 52363 m[IU]/mL — ABNORMAL HIGH (ref <5)

## 2019-11-05 MED ORDER — SODIUM CHLORIDE 0.9% FLUSH: 3.0000 mL | Freq: Once | INTRAVENOUS | Status: DC
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
## 2019-11-05 NOTE — Discharge Summary (Signed)
See final progress note. 

## 2019-11-05 NOTE — Discharge Instructions (Signed)
If you notice any bleeding, worsening pain or contractions come back.
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
## 2019-12-04 ENCOUNTER — Other Ambulatory Visit: Payer: Self-pay

## 2019-12-04 ENCOUNTER — Ambulatory Visit: Payer: Medicaid Other | Admitting: Certified Nurse Midwife

## 2019-12-04 VITALS — BP 120/80 | Wt 137.0 lb

## 2019-12-04 LAB — POCT URINALYSIS DIPSTICK OB
Glucose, UA: NEGATIVE
POC,PROTEIN,UA: NEGATIVE

## 2019-12-09 ENCOUNTER — Encounter: Payer: Self-pay | Admitting: Advanced Practice Midwife

## 2019-12-09 ENCOUNTER — Ambulatory Visit (INDEPENDENT_AMBULATORY_CARE_PROVIDER_SITE_OTHER): Payer: Medicaid Other | Admitting: Advanced Practice Midwife

## 2019-12-09 ENCOUNTER — Other Ambulatory Visit: Payer: Self-pay

## 2019-12-09 VITALS — BP 106/60 | Wt 138.0 lb

## 2019-12-09 DIAGNOSIS — Z3A24 24 weeks gestation of pregnancy: Secondary | ICD-10-CM

## 2019-12-09 DIAGNOSIS — Z131 Encounter for screening for diabetes mellitus: Secondary | ICD-10-CM

## 2019-12-09 DIAGNOSIS — O0992 Supervision of high risk pregnancy, unspecified, second trimester: Secondary | ICD-10-CM | POA: Diagnosis not present

## 2019-12-09 DIAGNOSIS — Z130 Encounter for screening for diseases of the blood and blood-forming organs and certain disorders involving the immune mechanism: Secondary | ICD-10-CM

## 2019-12-09 DIAGNOSIS — Z113 Encounter for screening for infections with a predominantly sexual mode of transmission: Secondary | ICD-10-CM

## 2019-12-09 LAB — POCT URINALYSIS DIPSTICK OB: Glucose, UA: NEGATIVE

## 2019-12-09 NOTE — Progress Notes (Signed)
Routine Prenatal Care Visit  Subjective  Sara Mcneil is a 24 y.o. [phone] at [redacted]w[redacted]d being seen today for ongoing prenatal care.  She is currently monitored for the following issues for this high-risk pregnancy and has Supervision of high risk pregnancy, antepartum; History of preterm delivery, currently pregnant; Abdominal trauma; Rubella non-immune status, antepartum; Maternal iron deficiency anemia affecting pregnancy in third trimester, antepartum; Low maternal serum vitamin B12; Frequent UTI; and Anxiety on their problem list.  ----------------------------------------------------------------------------------- Patient reports she was unable to wait to be seen for scheduled visit last week and has returned for ROB today.  She feels well and has no complaints. Contractions: Not present. Vag. Bleeding: None.  Movement: Present. Leaking Fluid denies.  ----------------------------------------------------------------------------------- The following portions of the patient's history were reviewed and updated as appropriate: allergies, current medications, past family history, past medical history, past social history, past surgical history and problem list. Problem list updated.  Objective  Blood pressure 106/60, weight 138 lb (62.6 kg), last menstrual period 06/20/2019 Pregravid weight 116 lb (52.6 kg) Total Weight Gain 22 lb (9.979 kg) Urinalysis: Urine Protein Trace  Urine Glucose Negative  Fetal Status: Fetal Heart Rate (bpm): 142 Fundal Height: 24 cm Movement: Present     General:  Alert, oriented and cooperative. Patient is in no acute distress.  Skin: Skin is warm and dry. No rash noted.   Cardiovascular: Normal heart rate noted  Respiratory: Normal respiratory effort, no problems with respiration noted  Abdomen: Soft, gravid, appropriate for gestational age. Pain/Pressure: Present     Pelvic:  Cervical exam deferred        Extremities: Normal range of motion.  Edema: None    Mental Status: Normal mood and affect. Normal behavior. Normal judgment and thought content.   Assessment   24 y.o. [id number] at [redacted]w[redacted]d by  03/26/2020, by Last Menstrual Period presenting for routine prenatal visit  Plan   pregnancy Problems (from 08/21/19 to present)    Problem Noted Resolved   Supervision of high risk pregnancy, antepartum 08/21/2019 by Conard Novak, MD No   Overview Addendum 11/04/2019  4:08 PM by Natale Milch, MD    Clinic [facility] Prenatal Labs  Dating LMP Blood type: O/Positive/-- (01/15 1552)   Genetic Screen declines Antibody:Negative (01/15 1552)  Anatomic [place] Complete [ ]  low lying placenta  Rubella: 1.09 (01/15 1552) Varicella: Non immune  GTT Third trimester:  RPR: Non Reactive (01/15 1552)   Rhogam  not needed HBsAg: Negative (01/15 1552)   TDaP vaccine                       Flu Shot: HIV: Non Reactive (01/15 1552)   Baby Food    Bottle                            GBS:   Contraception  Tubal ligation Consents signed 11/04/2019 Pap: ASCUS HPV+, 2020  CBB     CS/VBAC    Support Person           History of preterm delivery, currently pregnant 08/21/2019 by 08/23/2019, MD No       Preterm labor symptoms and general obstetric precautions including but not limited to vaginal bleeding, contractions, leaking of fluid and fetal movement were reviewed in detail with the patient.   Return in about 4 weeks (around 01/06/2020) for 28 wk labs and rob.  03/07/2020, CNM 12/09/2019 5:01  PM

## 2019-12-18 ENCOUNTER — Encounter: Payer: Medicaid Other | Admitting: Obstetrics and Gynecology

## 2020-01-08 ENCOUNTER — Encounter: Payer: Medicaid Other | Admitting: Certified Nurse Midwife

## 2020-01-08 ENCOUNTER — Ambulatory Visit (INDEPENDENT_AMBULATORY_CARE_PROVIDER_SITE_OTHER): Payer: Medicaid Other | Admitting: Certified Nurse Midwife

## 2020-01-08 ENCOUNTER — Other Ambulatory Visit: Payer: Medicaid Other

## 2020-01-08 ENCOUNTER — Other Ambulatory Visit: Payer: Self-pay

## 2020-01-08 VITALS — BP 80/58 | Wt 144.0 lb

## 2020-01-08 DIAGNOSIS — Z131 Encounter for screening for diabetes mellitus: Secondary | ICD-10-CM

## 2020-01-08 DIAGNOSIS — O4443 Low lying placenta NOS or without hemorrhage, third trimester: Secondary | ICD-10-CM

## 2020-01-08 DIAGNOSIS — Z3A28 28 weeks gestation of pregnancy: Secondary | ICD-10-CM

## 2020-01-08 DIAGNOSIS — O0993 Supervision of high risk pregnancy, unspecified, third trimester: Secondary | ICD-10-CM

## 2020-01-08 DIAGNOSIS — O0992 Supervision of high risk pregnancy, unspecified, second trimester: Secondary | ICD-10-CM

## 2020-01-08 DIAGNOSIS — N949 Unspecified condition associated with female genital organs and menstrual cycle: Secondary | ICD-10-CM

## 2020-01-08 DIAGNOSIS — Z113 Encounter for screening for infections with a predominantly sexual mode of transmission: Secondary | ICD-10-CM

## 2020-01-08 DIAGNOSIS — Z130 Encounter for screening for diseases of the blood and blood-forming organs and certain disorders involving the immune mechanism: Secondary | ICD-10-CM

## 2020-01-08 LAB — POCT URINALYSIS DIPSTICK OB
Glucose, UA: NEGATIVE
POC,PROTEIN,UA: NEGATIVE

## 2020-01-08 LAB — OB RESULTS CONSOLE HEPATITIS B SURFACE ANTIGEN: Hepatitis B Surface Ag: NEGATIVE

## 2020-01-08 LAB — OB RESULTS CONSOLE VARICELLA ZOSTER ANTIBODY, IGG: Varicella: NON-IMMUNE/NOT IMMUNE

## 2020-01-08 NOTE — Progress Notes (Signed)
ROB/1 hr GTT- lower abdominal pressure x couple of weeks

## 2020-01-09 LAB — 28 WEEK RH+PANEL
Basophils Absolute: 0 10*3/uL (ref 0.0–0.2)
Basos: 0 %
EOS (ABSOLUTE): 0 10*3/uL (ref 0.0–0.4)
Eos: 0 %
Gestational Diabetes Screen: 53 mg/dL — ABNORMAL LOW (ref 65–139)
HIV Screen 4th Generation wRfx: NONREACTIVE
Hematocrit: 34.5 % (ref 34.0–46.6)
Hemoglobin: 11.8 g/dL (ref 11.1–15.9)
Immature Grans (Abs): 0 10*3/uL (ref 0.0–0.1)
Immature Granulocytes: 0 %
Lymphocytes Absolute: 1.1 10*3/uL (ref 0.7–3.1)
Lymphs: 13 %
MCH: 32.6 pg (ref 26.6–33.0)
MCHC: 34.2 g/dL (ref 31.5–35.7)
MCV: 95 fL (ref 79–97)
Monocytes Absolute: 0.7 10*3/uL (ref 0.1–0.9)
Monocytes: 8 %
Neutrophils Absolute: 6.5 10*3/uL (ref 1.4–7.0)
Neutrophils: 79 %
Platelets: 214 10*3/uL (ref 150–450)
RBC: 3.62 x10E6/uL — ABNORMAL LOW (ref 3.77–5.28)
RDW: 11.8 % (ref 11.7–15.4)
RPR Ser Ql: NONREACTIVE
WBC: 8.3 10*3/uL (ref 3.4–10.8)

## 2020-01-09 NOTE — Progress Notes (Signed)
ROB at 28wk6d: Complains of pain/discomfort in bilateral lower abdominal quadrants usually after standing/ working for long hours. Has noticed some irregular contractions. No vaginal bleeding, but increased clear to white vaginal discharge. Baby active.No dysuria. Having 28 week labs today  Exam: General: in NAD Abdomen: Soft, mild tenderness with palpation of lower uterine borders, FHTs WNL. FH28cm Pelvic exam: External/BUS: no inflammation or lesions Vagina: white vaginal discharge Cervix: Thick/closed/ -3 Wet prep: negative hyphae, Trich, Clue cells O POS blood type  A: IUP at 28wk6d S=D Probable round ligament pain  P: Comfort measures for round ligament pain discussed. Recommend maternity support garment ROB/ growth scan and placental location in 2 weeks Breast  Sara Mcneil, CNM

## 2020-01-22 ENCOUNTER — Ambulatory Visit (INDEPENDENT_AMBULATORY_CARE_PROVIDER_SITE_OTHER): Payer: Medicaid Other | Admitting: Certified Nurse Midwife

## 2020-01-22 ENCOUNTER — Ambulatory Visit (INDEPENDENT_AMBULATORY_CARE_PROVIDER_SITE_OTHER): Payer: Medicaid Other

## 2020-01-22 ENCOUNTER — Other Ambulatory Visit: Payer: Self-pay

## 2020-01-22 VITALS — BP 99/68 | Ht 67.0 in | Wt 145.6 lb

## 2020-01-22 DIAGNOSIS — O09893 Supervision of other high risk pregnancies, third trimester: Secondary | ICD-10-CM

## 2020-01-22 DIAGNOSIS — O0993 Supervision of high risk pregnancy, unspecified, third trimester: Secondary | ICD-10-CM

## 2020-01-22 DIAGNOSIS — O09899 Supervision of other high risk pregnancies, unspecified trimester: Secondary | ICD-10-CM

## 2020-01-22 DIAGNOSIS — Z23 Encounter for immunization: Secondary | ICD-10-CM | POA: Diagnosis not present

## 2020-01-22 DIAGNOSIS — O4443 Low lying placenta NOS or without hemorrhage, third trimester: Secondary | ICD-10-CM | POA: Diagnosis not present

## 2020-01-22 DIAGNOSIS — Z3A30 30 weeks gestation of pregnancy: Secondary | ICD-10-CM

## 2020-01-22 LAB — POCT URINALYSIS DIPSTICK OB
Glucose, UA: NEGATIVE
POC,PROTEIN,UA: NEGATIVE

## 2020-01-25 NOTE — Progress Notes (Signed)
ROB at 30wk6d: Having episodes of regular contractions. Continues to have pelvic pressure. No vaginal bleeding or leakage of water. Good fetal movement Would like to start FMLA at 34 weeks (delivered previously at 35 weeks x 2)  Ultrasound today: CGA 31 weeks. EFW 45.9% (3#9oz)/ cephalic/ AFI 9.4 cm/ placenta anterior and no linger low lying  FH 29cm/ FHT 145/ TWG 30# BP 99/68  A: IUP at 30wk6d with normal growth on ultrasound Low lying placenta resolved Hx of PTD x 2  P: Desires to breast feed TDAP today Signed blood transfusion consent PTL precautions ROB in 2 weeks Start FMLA at 34 weeks  Farrel Conners, CNM

## 2020-02-04 ENCOUNTER — Other Ambulatory Visit: Payer: Self-pay

## 2020-02-04 ENCOUNTER — Telehealth: Payer: Self-pay

## 2020-02-04 ENCOUNTER — Observation Stay
Admission: EM | Admit: 2020-02-04 | Discharge: 2020-02-04 | Disposition: A | Discharge status: Home / Self Care | Payer: Medicaid Other | Attending: Obstetrics and Gynecology | Admitting: Obstetrics and Gynecology

## 2020-02-04 ENCOUNTER — Encounter: Payer: Self-pay | Admitting: Obstetrics and Gynecology

## 2020-02-04 DIAGNOSIS — Z888 Allergy status to other drugs, medicaments and biological substances status: Secondary | ICD-10-CM | POA: Diagnosis not present

## 2020-02-04 DIAGNOSIS — Z79899 Other long term (current) drug therapy: Secondary | ICD-10-CM | POA: Insufficient documentation

## 2020-02-04 DIAGNOSIS — O4693 Antepartum hemorrhage, unspecified, third trimester: Secondary | ICD-10-CM | POA: Diagnosis present

## 2020-02-04 DIAGNOSIS — Z882 Allergy status to sulfonamides status: Secondary | ICD-10-CM | POA: Insufficient documentation

## 2020-02-04 DIAGNOSIS — O0990 Supervision of high risk pregnancy, unspecified, unspecified trimester: Secondary | ICD-10-CM

## 2020-02-04 DIAGNOSIS — O26893 Other specified pregnancy related conditions, third trimester: Principal | ICD-10-CM | POA: Insufficient documentation

## 2020-02-04 DIAGNOSIS — Z3A32 32 weeks gestation of pregnancy: Secondary | ICD-10-CM | POA: Diagnosis not present

## 2020-02-04 DIAGNOSIS — O09899 Supervision of other high risk pregnancies, unspecified trimester: Secondary | ICD-10-CM

## 2020-02-04 LAB — URINALYSIS, COMPLETE (UACMP) WITH MICROSCOPIC
Bilirubin Urine: NEGATIVE
Glucose, UA: NEGATIVE mg/dL
Hgb urine dipstick: NEGATIVE
Ketones, ur: NEGATIVE mg/dL
Nitrite: NEGATIVE
Protein, ur: NEGATIVE mg/dL
Specific Gravity, Urine: 1.02 (ref 1.005–1.030)
pH: 8 (ref 5.0–8.0)

## 2020-02-04 LAB — CHLAMYDIA/NGC RT PCR (ARMC ONLY)
Chlamydia Tr: NOT DETECTED
N gonorrhoeae: NOT DETECTED

## 2020-02-04 MED ORDER — ACETAMINOPHEN 325 MG PO TABS: 650.0000 mg | ORAL_TABLET | ORAL | Status: DC | PRN

## 2020-02-04 MED ORDER — BETAMETHASONE SOD PHOS & ACET 6 (3-3) MG/ML IJ SUSP
INTRAMUSCULAR | Status: AC
  Administered 2020-02-04: 12 mg via INTRAMUSCULAR
  Filled 2020-02-04: qty 5

## 2020-02-04 MED ORDER — BETAMETHASONE SOD PHOS & ACET 6 (3-3) MG/ML IJ SUSP: 12.0000 mg | INTRAMUSCULAR | Status: DC

## 2020-02-04 NOTE — H&P (Signed)
Obstetric H&P   Chief Complaint: Vaginal bleeding  Prenatal Care Provider: [facility]  History of Present Illness: 24 y.o. [id number] 63w5dby 03/26/2020, by Last Menstrual Period presenting to L&D with third trimester bleeding.  Patient was noted to have a low placenta which resolved on follow up imaging.  Her pregnancy history is notable for two 35 week deliveries, declines Makena.  No LOF, +FM, no contractions.  She denies preceding intercourse (last 3-4 days ago).  She is Rh positive.    Pregravid weight 52.6 kg Total Weight Gain 15.4 kg  pregnancy Problems (from 08/21/19 to present)    Problem Noted Resolved   Supervision of high risk pregnancy, antepartum 08/21/2019 by JWill Bonnet MD No   Overview Addendum 01/25/2020  9:54 PM by GDalia Heading CCienegas TerracePrenatal Labs  Dating LMP Blood type: O/Positive/-- (01/15 1552)   Genetic Screen declines Antibody:Negative (01/15 1552)  Anatomic [place] low lying placenta -resolved Rubella: 1.09 (01/15 1552) Varicella: Non immune  GTT Third trimester: 53 RPR: Non Reactive (01/15 1552)   Rhogam  not needed HBsAg: Negative (01/15 1552)   TDaP vaccine     01/22/20                  Flu Shot: HIV: Non Reactive (01/15 1552)   Baby Food    Bottle                            GBS:   Contraception  Tubal ligation Consents signed 11/04/2019 Pap: ASCUS HPV+, 2020  CBB     CS/VBAC    Support Person           Previous Version   History of preterm delivery, currently pregnant 08/21/2019 by JWill Bonnet MD No       Review of Systems: 10 point review of systems negative unless otherwise noted in HPI  Past Medical History: Patient Active Problem List   Diagnosis Date Noted  . Vaginal bleeding in pregnancy, third trimester 02/04/2020  . Abdominal trauma 11/05/2019  . Supervision of high risk pregnancy, antepartum 08/21/2019    Clinic [facility] Prenatal Labs  Dating LMP Blood type: O/Positive/-- (01/15 1552)   Genetic  Screen declines Antibody:Negative (01/15 1552)  Anatomic [place] low lying placenta -resolved Rubella: 1.09 (01/15 1552) Varicella: Non immune  GTT Third trimester: 53 RPR: Non Reactive (01/15 1552)   Rhogam  not needed HBsAg: Negative (01/15 1552)   TDaP vaccine     01/22/20                  Flu Shot: HIV: Non Reactive (01/15 1552)   Baby Food    Bottle                            GBS:   Contraception  Tubal ligation Consents signed 11/04/2019 Pap: ASCUS HPV+, 2020  CBB     CS/VBAC    Support Person        . History of preterm delivery, currently pregnant 08/21/2019  . Maternal iron deficiency anemia affecting pregnancy in third trimester, antepartum 01/13/2018    Formatting of this note might be different from the original. IV iron, B12 IM 02/04/18   . Low maternal serum vitamin B12 01/13/2018  . Rubella non-immune status, antepartum 11/18/2017  . Frequent UTI 11/12/2017  . Anxiety 11/12/2017    Past Surgical  History: Past Surgical History:  Procedure Laterality Date  . NO PAST SURGERIES      Past Obstetric History: # 1 - Date: None, Sex: None, Weight: None, GA: None, Delivery: None, Apgar1: None, Apgar5: None, Living: None, Birth Comments: None  # 2 - Date: 09/10/14, Sex: Female, Weight: 2807 g, GA: [redacted]w[redacted]d Delivery: Vaginal, Spontaneous, Apgar1: None, Apgar5: None, Living: Living, Birth Comments: scant prenatal care  # 3 - Date: 02/16/18, Sex: Female, Weight: 2690 g, GA: 322w4dDelivery: Vaginal, Forceps, Apgar1: 9, Apgar5: 9, Living: Living, Birth Comments: None  # 4 - Date: None, Sex: None, Weight: None, GA: None, Delivery: None, Apgar1: None, Apgar5: None, Living: None, Birth Comments: None  Family History: History reviewed. No pertinent family history.  Social History: Social History   Socioeconomic History  . Marital status: Significant Other    Spouse name: Not on file  . Number of children: Not on file  . Years of education: Not on file  . Highest education  level: Not on file  Occupational History  . Not on file  Tobacco Use  . Smoking status: Never Smoker  . Smokeless tobacco: Never Used  Vaping Use  . Vaping Use: Never assessed  Substance and Sexual Activity  . Alcohol use: Not Currently  . Drug use: Not Currently  . Sexual activity: Yes    Birth control/protection: Surgical    Comment: BTL  Other Topics Concern  . Not on file  Social History Narrative  . Not on file   Social Determinants of Health   Financial Resource Strain:   . Difficulty of Paying Living Expenses:   Food Insecurity:   . Worried About RuCharity fundraisern the Last Year:   . RaArboriculturistn the Last Year:   Transportation Needs:   . LaFilm/video editorMedical):   . Marland Kitchenack of Transportation (Non-Medical):   Physical Activity:   . Days of Exercise per Week:   . Minutes of Exercise per Session:   Stress:   . Feeling of Stress :   Social Connections:   . Frequency of Communication with Friends and Family:   . Frequency of Social Gatherings with Friends and Family:   . Attends Religious Services:   . Active Member of Clubs or Organizations:   . Attends ClArchivisteetings:   . Marland Kitchenarital Status:   Intimate Partner Violence:   . Fear of Current or Ex-Partner:   . Emotionally Abused:   . Marland Kitchenhysically Abused:   . Sexually Abused:     Medications: Prior to Admission medications   Medication Sig Start Date End Date Taking? Authorizing Provider  ondansetron (ZOFRAN ODT) 4 MG disintegrating tablet Take 1 tablet (4 mg total) by mouth every 8 (eight) hours as needed for nausea or vomiting. 07/08/19  Yes IsDuffy BruceMD  Prenatal MV & Min w/FA-DHA (PRENATAL ADULT GUMMY/DHA/FA) 0.4-25 MG CHEW Chew 2 tablets by mouth daily.   Yes [provider]    Allergies: Allergies  Allergen Reactions  . Sulfa Antibiotics Hives  . Iodine   . Other Hives    Itching and small hives s/p iodine injection    Physical Exam: Vitals: Blood  pressure (!) 109/59, pulse (!) 108, temperature 98.5 F (36.9 C), temperature source Oral, resp. rate 15, height '5\' 8"'  (1.727 m), weight 68 kg, last menstrual period 06/20/2019, unknown if currently breastfeeding.  UA pending  FHT: 140, moderate, +accels, no decels Toco: none  General: NAD HEENT: normocephalic, anicteric  Pulmonary: No increased work of breathing Cardiovascular: RRR, distal pulses 2+ Abdomen: Gravid, non-tender Leopolds: vtx Genitourinary:  External: Normal external female genitalia.  Normal urethral meatus, normal Bartholin's and Skene's glands.    Vagina: Normal vaginal mucosa  Cervix: Prominent cervical ectroprion, no bleeding, 1/50/ballotable   Uterus: gravid, non-tender  Rectal: deferred Extremities: no edema, erythema, or tenderness Neurologic: Grossly intact Psychiatric: mood appropriate, affect full  Labs: No results found for this or any previous visit (from the past 24 hour(s)).  Assessment: 24 y.o. [id number] 6w5dby 03/26/2020, by Last Menstrual Period presenting with third trimester bleeding  Plan: 1) Third trimester bleeding - no bleeding noted on exam.  Cervix is 1cm dilated with prominent ectropion.  Last pap ASCUS HPV positive repeat postpartum.   - recheck in 1-2hrs - BMZ x 1 today if cervix stable may return tomorrow for second dose - UA to rule out hematuria as cause - GC/CT to rule out cervicitis  2) Fetus - cat I tracing  3) PNL - Blood type O/Positive/-- (01/15 1552) / Anti-bodyscreen Negative (01/15 1552) / Rubella 1.09 (01/15 1552) / Varicella Non-Immune / RPR Non Reactive (06/04 1009) / HBsAg Negative (01/15 1552) / HIV Non Reactive (06/04 1009) / 1-hr OGTT 53 / GBS unknown  4) Immunization History -  Immunization History  Administered Date(s) Administered  . HPV 9-valent 12/10/2014  . HPV Quadrivalent 04/07/2013, 12/31/2013  . Influenza,inj,Quad PF,6+ Mos 05/06/2013, 08/17/2014, 05/15/2016  . MMR 02/18/2018  . Tdap 09/12/2014,  12/24/2017, 01/22/2020  . Varicella 02/18/2018    5) Disposition - pending delivery  AMalachy Mood MD, FWater[place] CPark CrestGroup 02/04/2020, 4:52 PM

## 2020-02-04 NOTE — Discharge Summary (Signed)
Physician Final Progress Note  Patient ID: Sara Mcneil MRN: [id number] DOB/AGE: 1996-02-23 24 y.o.  Admit date: 02/04/2020 Admitting provider: Vena Austria, MD Discharge date: 02/04/2020   Admission Diagnoses: Vaginal bleeding in pregnancy, third trimester  Discharge Diagnoses:  Active Problems:   Vaginal bleeding in pregnancy, third trimester  24 y.o. [id number] at [redacted]w[redacted]d by Estimated Date of Delivery: 03/26/20 presenting with light pink spotting she noted after voiding.  No LOF, +FM, no ctx.  Pregnancy notable for low lying placenta, resolved on follow up imaging, and history of 2 prior 35 week deliveries.    On presentation cervix 1/50/ballotable.  GC/CT negative.  No change on 2-hr recheck.  UA negative.  Patient received dose 1 of BMZ with dose 2 tomorrow at 1700.  Temp:  [98.5 F (36.9 C)] 98.5 F (36.9 C) (07/01 1845) Pulse Rate:  [93-108] 93 (07/01 1845) Resp:  [15] 15 (07/01 1612) BP: (109-120)/(59-73) 120/73 (07/01 1845) Weight:  [68 kg] 68 kg (07/01 1612)  Consults: None  Significant Findings/ Diagnostic Studies:  Results for orders placed or performed during the hospital encounter of 02/04/20 (from the past 24 hour(s))  Urinalysis, Complete w Microscopic     Status: Abnormal   Collection Time: 02/04/20  4:39 PM  Result Value Ref Range   Color, Urine YELLOW YELLOW   APPearance CLEAR CLEAR   Specific Gravity, Urine 1.020 1.005 - 1.030   pH 8.0 5.0 - 8.0   Glucose, UA NEGATIVE NEGATIVE mg/dL   Hgb urine dipstick NEGATIVE NEGATIVE   Bilirubin Urine NEGATIVE NEGATIVE   Ketones, ur NEGATIVE NEGATIVE mg/dL   Protein, ur NEGATIVE NEGATIVE mg/dL   Nitrite NEGATIVE NEGATIVE   Leukocytes,Ua SMALL (A) NEGATIVE   Squamous Epithelial / LPF 0-5 0 - 5   WBC, UA 0-5 0 - 5 WBC/hpf   RBC / HPF 0-5 0 - 5 RBC/hpf   Bacteria, UA RARE (A) NONE SEEN  Chlamydia/NGC rt PCR ([facility] only)     Status: None   Collection Time: 02/04/20  4:43 PM   Specimen: Cervical/Vaginal swab;  Genital  Result Value Ref Range   Specimen source GC/Chlam ENDOCERVICAL    Chlamydia Tr NOT DETECTED NOT DETECTED   N gonorrhoeae NOT DETECTED NOT DETECTED     Procedures: NST  140, moderate, +accels, no decels Toco: irritability   Reactive category I tracing  Discharge Condition: good  Disposition: Discharge disposition: 01-Home or Self Care       Diet: Regular diet  Discharge Activity: Activity as tolerated  Discharge Instructions    Discharge activity:  No Restrictions   Complete by: As directed    Discharge diet:  No restrictions   Complete by: As directed    No sexual activity restrictions   Complete by: As directed    Notify physician for a general feeling that "something is not right"   Complete by: As directed    Notify physician for increase or change in vaginal discharge   Complete by: As directed    Notify physician for intestinal cramps, with or without diarrhea, sometimes described as "gas pain"   Complete by: As directed    Notify physician for leaking of fluid   Complete by: As directed    Notify physician for low, dull backache, unrelieved by heat or Tylenol   Complete by: As directed    Notify physician for menstrual like cramps   Complete by: As directed    Notify physician for pelvic pressure   Complete by: As directed  Notify physician for uterine contractions.  These may be painless and feel like the uterus is tightening or the baby is  "balling up"   Complete by: As directed    Notify physician for vaginal bleeding   Complete by: As directed    PRETERM LABOR:  Includes any of the follwing symptoms that occur between 20 - [redacted] weeks gestation.  If these symptoms are not stopped, preterm labor can result in preterm delivery, placing your baby at risk   Complete by: As directed      Allergies as of 02/04/2020      Reactions   Sulfa Antibiotics Hives   Iodine    Other Hives   Itching and small hives s/p iodine injection      Medication  List    TAKE these medications   ondansetron 4 MG disintegrating tablet Commonly known as: Zofran ODT Take 1 tablet (4 mg total) by mouth every 8 (eight) hours as needed for nausea or vomiting.   Prenatal Adult Gummy/DHA/FA 0.4-25 MG Chew Chew 2 tablets by mouth daily.        Total time spent taking care of this patient: 30 minutes  Signed: Vena Austria 02/04/2020, 6:49 PM

## 2020-02-04 NOTE — Telephone Encounter (Signed)
Mindy in L&D notified.

## 2020-02-04 NOTE — Telephone Encounter (Signed)
Pt calling; has question.  [phone]  Pt states that for about a month now she's been hurting 'in her lower'; she went to pee and there was a little bit of blood in her d/c; pelvic belt doesn't help with the pain; pain is 4/10; is on feet 8-9 hours at work.  Adv will send msg to [facility] and get back with her.

## 2020-02-04 NOTE — OB Triage Note (Signed)
Pt discharged home with education on signs and symptoms of preterm labor. Pt instructed when to return to ED and has no further questions at this time. Pt verbalized understanding of instructions and discharged home in stable condition.

## 2020-02-04 NOTE — Telephone Encounter (Signed)
I talked with patient and given her history advised her to go to L&D to see if dilated/ contracting. Farrel Conners, CNM

## 2020-02-04 NOTE — OB Triage Note (Addendum)
Pt is a 23yo G4P2 at [redacted]w[redacted]d that presents from ED with c/o vaginal bleeding. Pt states "there were a couple of small small pink strands on the toilet paper." Pt states she last had intercourse 3 days ago. Pt has a history of two preterm deliveries. Pt states positive FM and states she has had some moisture in her panties. No VB noted on initial assessment. Pt states she has had low back pain and pressure for about a month now. EFM applied and initial fht 145.

## 2020-02-05 ENCOUNTER — Observation Stay
Admission: RE | Admit: 2020-02-05 | Discharge: 2020-02-05 | Disposition: A | Discharge status: Home / Self Care | Payer: Medicaid Other | Attending: Obstetrics and Gynecology | Admitting: Obstetrics and Gynecology

## 2020-02-05 ENCOUNTER — Encounter: Payer: Medicaid Other | Admitting: Obstetrics

## 2020-02-05 DIAGNOSIS — Z3A32 32 weeks gestation of pregnancy: Secondary | ICD-10-CM | POA: Insufficient documentation

## 2020-02-05 MED ORDER — BETAMETHASONE SOD PHOS & ACET 6 (3-3) MG/ML IJ SUSP
12.0000 mg | Freq: Once | INTRAMUSCULAR | Status: AC
  Administered 2020-02-05: 12 mg via INTRAMUSCULAR

## 2020-02-10 ENCOUNTER — Encounter: Payer: Self-pay | Admitting: Obstetrics & Gynecology

## 2020-02-10 ENCOUNTER — Telehealth: Payer: Self-pay

## 2020-02-10 ENCOUNTER — Observation Stay
Admission: EM | Admit: 2020-02-10 | Discharge: 2020-02-10 | Disposition: A | Discharge status: Home / Self Care | Payer: Medicaid Other | Attending: Certified Nurse Midwife | Admitting: Certified Nurse Midwife

## 2020-02-10 DIAGNOSIS — N898 Other specified noninflammatory disorders of vagina: Secondary | ICD-10-CM | POA: Diagnosis not present

## 2020-02-10 DIAGNOSIS — Z3A33 33 weeks gestation of pregnancy: Secondary | ICD-10-CM | POA: Diagnosis not present

## 2020-02-10 DIAGNOSIS — O4193X0 Disorder of amniotic fluid and membranes, unspecified, third trimester, not applicable or unspecified: Secondary | ICD-10-CM | POA: Diagnosis not present

## 2020-02-10 DIAGNOSIS — O26893 Other specified pregnancy related conditions, third trimester: Secondary | ICD-10-CM | POA: Diagnosis not present

## 2020-02-10 DIAGNOSIS — O09213 Supervision of pregnancy with history of pre-term labor, third trimester: Secondary | ICD-10-CM

## 2020-02-10 NOTE — OB Triage Note (Signed)
Pt is a 23yo [id number] at [redacted]w[redacted]d that presents from ED with c/o LOF since 1100 this morning. Pt states "My panties stay wet, and I had a gush when I got out of the car." Pt denies VB and states positive FM. Pt has history of two preterm deliveries and has been given 2 doses of BMZ on 7/1 and 7/2. EFM applied and initial FHT 135.

## 2020-02-10 NOTE — Telephone Encounter (Signed)
Pt returned call; is unable to stay dry; doesn't smell like pee.  Adv to go to L&D via ED.  Misty notified.

## 2020-02-10 NOTE — Final Progress Note (Signed)
Physician Final Progress Note  Patient ID: Sara Mcneil MRN: [id number] DOB/AGE: 1995-10-22 24 y.o.  Admit date: 02/10/2020 Admitting provider: Nadara Mustard, MD/ Gasper Lloyd. Sharen Hones, CNM Discharge date: 02/10/2020   Admission Diagnoses: IUP at 33wk4d with leakage of fluid  Discharge Diagnoses:  IUP at 33wk4d. No evidence of spontaneous rupture of membranes  Consults: None  Significant Findings/ Diagnostic Studies: 24 y.o. [id number] at [redacted]w[redacted]d by Estimated Date of Delivery: 03/26/20 presenting with complaints of leakage of fluid since 1100 this AM. Patient stated "My panties stay wet, and I had a gush when I got out of the car."  +FM, no vaginal bleeding or regular contractions. Has some back pain. Pt has history of two preterm deliveries at 35 weeks and was given 2 doses of BMZ on 7/1 and 7/2 after presenting with spotting and contractions. Her most recent cervical exam was 1/60%/-1 to 0. An ultrasound on 6/18 gave EFW in the 46th% and AFI 9.4cm.   [facility] care at [facility] has also been remarkable for the following: [facility] Prenatal Labs  Dating LMP Blood type: O/Positive/-- (01/15 1552)   Genetic Screen declines Antibody:Negative (01/15 1552)  Anatomic [place] Complete low lying placenta -resolved Rubella: 1.09 (01/15 1552) Varicella: Non immune  GTT Third trimester: 53 RPR: Non Reactive (01/15 1552)   Rhogam  not needed HBsAg: Negative (01/15 1552)   TDaP vaccine     01/22/20                  Flu Shot: HIV: Non Reactive (01/15 1552)   Baby Food    Bottle                            GBS:   Contraception  Tubal ligation Consents signed 11/04/2019 Pap: ASCUS HPV+, 2020  CBB     CS/VBAC    Support Person     Past Medical History:  Diagnosis Date  . HPV (human papilloma virus) infection   . PID (acute pelvic inflammatory disease)   . Postpartum care following vaginal delivery 02/18/2018  . Preterm delivery    Past Surgical History:  Procedure Laterality Date  . NO PAST  SURGERIES     Social History   Socioeconomic History  . Marital status: Significant Other    Spouse name: Not on file  . Number of children: Not on file  . Years of education: Not on file  . Highest education level: Not on file  Occupational History  . Not on file  Tobacco Use  . Smoking status: Never Smoker  . Smokeless tobacco: Never Used  Vaping Use  . Vaping Use: Never assessed  Substance and Sexual Activity  . Alcohol use: Not Currently  . Drug use: Not Currently  . Sexual activity: Yes    Birth control/protection: Surgical    Comment: BTL  Other Topics Concern  . Not on file  Social History Narrative  . Not on file   Social Determinants of Health   Financial Resource Strain:   . Difficulty of Paying Living Expenses:   Food Insecurity:   . Worried About Programme researcher, broadcasting/film/video in the Last Year:   . Barista in the Last Year:   Transportation Needs:   . Freight forwarder (Medical):   Marland Kitchen Lack of Transportation (Non-Medical):   Physical Activity:   . Days of Exercise per Week:   . Minutes of Exercise per Session:  Stress:   . Feeling of Stress :   Social Connections:   . Frequency of Communication with Friends and Family:   . Frequency of Social Gatherings with Friends and Family:   . Attends Religious Services:   . Active Member of Clubs or Organizations:   . Attends Banker Meetings:   Marland Kitchen Marital Status:   Intimate Partner Violence:   . Fear of Current or Ex-Partner:   . Emotionally Abused:   Marland Kitchen Physically Abused:   . Sexually Abused:     Family history: negative for breast and ovarian cancer  Exam: Vital Signs: BP 106/64   Pulse 91   Temp 98.4 F (36.9 C) (Oral)   Resp 16   LMP 06/20/2019   General: gravid WF in NAD Chest/ Respiratory: normal respiratory effort Abdomen: gravid, soft, NT SSE: External/BUS: dry, no lesions Vagina: white mucoepithelial discharge  Negative pooling, negative ferning  Ultrasound: cephalic  presentation, AFI 9.27cm, anterior placenta  FHR: baseline 130 with accelerations to 150s to 160s, moderate variability Toco: mostly irritability, occasional mild contraction  A: IUP at 33wk4d with no evidence of SROM  P: Discharge home with labor precautions     Procedures: none   Discharge Condition: good  Disposition: Discharge disposition: 01-Home or Self Care       Diet: Regular diet  Discharge Activity: Activity as tolerated   Allergies as of 02/10/2020      Reactions   Sulfa Antibiotics Hives   Iodine    Other Hives   Itching and small hives s/p iodine injection      Medication List    TAKE these medications   ondansetron 4 MG disintegrating tablet Commonly known as: Zofran ODT Take 1 tablet (4 mg total) by mouth every 8 (eight) hours as needed for nausea or vomiting.   Prenatal Adult Gummy/DHA/FA 0.4-25 MG Chew Chew 2 tablets by mouth daily.        Total time spent taking care of this patient: 30 minutes  Signed: Farrel Conners 02/10/2020, 3:06 PM

## 2020-02-10 NOTE — Telephone Encounter (Signed)
Pt calling; water hasn't broke but is leaking.  [phone].  LMTC

## 2020-02-10 NOTE — OB Triage Note (Signed)
Discharge instructions reviewed with patient and patient verbalized understanding of instructions. Pt instructed when to return to hospital for evaluation and educated on signs of labor. Pt denies any further questions at this time and discharged home in stable condition with husband.

## 2020-02-12 ENCOUNTER — Ambulatory Visit (INDEPENDENT_AMBULATORY_CARE_PROVIDER_SITE_OTHER): Payer: Medicaid Other | Admitting: Obstetrics

## 2020-02-12 ENCOUNTER — Other Ambulatory Visit: Payer: Self-pay

## 2020-02-12 VITALS — BP 112/68 | Wt 148.0 lb

## 2020-02-12 DIAGNOSIS — O0990 Supervision of high risk pregnancy, unspecified, unspecified trimester: Secondary | ICD-10-CM

## 2020-02-12 DIAGNOSIS — Z3A33 33 weeks gestation of pregnancy: Secondary | ICD-10-CM

## 2020-02-12 DIAGNOSIS — O0993 Supervision of high risk pregnancy, unspecified, third trimester: Secondary | ICD-10-CM

## 2020-02-12 LAB — POCT URINALYSIS DIPSTICK OB
Glucose, UA: NEGATIVE
POC,PROTEIN,UA: NEGATIVE

## 2020-02-12 NOTE — Progress Notes (Signed)
ROB 34 week instructions given Follow up from L&D/Dilation 1cm

## 2020-02-12 NOTE — Progress Notes (Signed)
Routine Prenatal Care Visit  Subjective  Sara Mcneil is a 24 y.o. [phone] at [redacted]w[redacted]d being seen today for ongoing prenatal care.  She is currently monitored for the following issues for this high-risk pregnancy and has Supervision of high risk pregnancy, antepartum; History of preterm delivery, currently pregnant; Abdominal trauma; Rubella non-immune status, antepartum; Maternal iron deficiency anemia affecting pregnancy in third trimester, antepartum; Low maternal serum vitamin B12; Frequent UTI; Anxiety; Vaginal bleeding in pregnancy, third trimester; and Indication for care in labor and delivery, antepartum on their problem list.  ----------------------------------------------------------------------------------- Patient reports contractions since the last few weeks with several visits to the unit for evaluation. She has suspecte dSROM but this has been ruled out. Feeling pelvic pressure. Received BMZ recently. Hx of two preterm deliveries, and with one took [place]. She declined Makena this pregnancy. and no bleeding.    .  .   Pincus Large Fluid denies.  ----------------------------------------------------------------------------------- The following portions of the patient's history were reviewed and updated as appropriate: allergies, current medications, past family history, past medical history, past social history, past surgical history and problem list. Problem list updated.  Objective  Blood pressure 112/68, weight 148 lb (67.1 kg), last menstrual period 06/20/2019, unknown if currently breastfeeding. Pregravid weight 116 lb (52.6 kg) Total Weight Gain 32 lb (14.5 kg) Urinalysis: Urine Protein Negative  Urine Glucose Negative  Fetal Status:           General:  Alert, oriented and cooperative. Patient is in no acute distress.  Skin: Skin is warm and dry. No rash noted.   Cardiovascular: Normal heart rate noted  Respiratory: Normal respiratory effort, no problems with respiration  noted  Abdomen: Soft, gravid, appropriate for gestational age.       Pelvic:  Cervical exam performed        Extremities: Normal range of motion.     Mental Status: Normal mood and affect. Normal behavior. Normal judgment and thought content.   Assessment   24 y.o. [id number] at [redacted]w[redacted]d by  03/26/2020, by Last Menstrual Period presenting for work-in prenatal visit High Risk for preterm birth. No cervical change Uterine "irritability"  Plan   pregnancy Problems (from 08/21/19 to present)    Problem Noted Resolved   Supervision of high risk pregnancy, antepartum 08/21/2019 by Conard Novak, MD No   Overview Addendum 02/10/2020  6:47 PM by Farrel Conners, CNM    [facility] Prenatal Labs  Dating LMP Blood type: O/Positive/-- (01/15 1552)   Genetic Screen declines Antibody:Negative (01/15 1552)  Anatomic [place] Complete low lying placenta -resolved Rubella: 1.09 (01/15 1552) Varicella: Non immune  GTT Third trimester: 53 RPR: Non Reactive (01/15 1552)   Rhogam  not needed HBsAg: Negative (01/15 1552)   TDaP vaccine     01/22/20                  Flu Shot: HIV: Non Reactive (01/15 1552)   Baby Food    Bottle                            GBS:   Contraception  Tubal ligation Consents signed 11/04/2019 Pap: ASCUS HPV+, 2020  CBB     CS/VBAC    Support Person           Previous Version   History of preterm delivery, currently pregnant 08/21/2019 by Conard Novak, MD No       Preterm labor symptoms and general  obstetric precautions including but not limited to vaginal bleeding, contractions, leaking of fluid and fetal movement were reviewed in detail with the patient. Please refer to After Visit Summary for other counseling recommendations.   Return in about 1 week (around 02/19/2020) for return OB - preterm labor check.  Mirna Mires, CNM  02/12/2020 11:58 AM

## 2020-02-19 ENCOUNTER — Other Ambulatory Visit: Payer: Self-pay

## 2020-02-19 ENCOUNTER — Ambulatory Visit (INDEPENDENT_AMBULATORY_CARE_PROVIDER_SITE_OTHER): Payer: Medicaid Other | Admitting: Obstetrics

## 2020-02-19 VITALS — BP 104/70 | Wt 151.0 lb

## 2020-02-19 DIAGNOSIS — O09899 Supervision of other high risk pregnancies, unspecified trimester: Secondary | ICD-10-CM

## 2020-02-19 DIAGNOSIS — Z3A34 34 weeks gestation of pregnancy: Secondary | ICD-10-CM

## 2020-02-19 DIAGNOSIS — O0990 Supervision of high risk pregnancy, unspecified, unspecified trimester: Secondary | ICD-10-CM

## 2020-02-19 LAB — POCT URINALYSIS DIPSTICK OB
Glucose, UA: NEGATIVE
POC,PROTEIN,UA: NEGATIVE

## 2020-02-19 NOTE — Addendum Note (Signed)
Addended by: Donnetta Hail on: 02/19/2020 10:51 AM   Modules accepted: Orders

## 2020-02-19 NOTE — Progress Notes (Signed)
ROB- no new concerns

## 2020-02-19 NOTE — Progress Notes (Signed)
Routine Prenatal Care Visit  Subjective  Sara Mcneil is a 24 y.o. [phone] at [redacted]w[redacted]d being seen today for ongoing prenatal care.  She is currently monitored for the following issues for this high-risk pregnancy and has Supervision of high risk pregnancy, antepartum; History of preterm delivery, currently pregnant; Abdominal trauma; Rubella non-immune status, antepartum; Maternal iron deficiency anemia affecting pregnancy in third trimester, antepartum; Low maternal serum vitamin B12; Frequent UTI; Anxiety; Vaginal bleeding in pregnancy, third trimester; and Indication for care in labor and delivery, antepartum on their problem list.  ----------------------------------------------------------------------------------- Patient reports no bleeding, no contractions and no cramping.   Contractions: Not present.  .  Movement: Present. Leaking Fluid denies.  ----------------------------------------------------------------------------------- The following portions of the patient's history were reviewed and updated as appropriate: allergies, current medications, past family history, past medical history, past social history, past surgical history and problem list. Problem list updated.  Objective  Blood pressure 104/70, weight 151 lb (68.5 kg), last menstrual period 06/20/2019, unknown if currently breastfeeding. Pregravid weight 116 lb (52.6 kg) Total Weight Gain 35 lb (15.9 kg) Urinalysis: Urine Protein    Urine Glucose    Fetal Status: Fetal Heart Rate (bpm): 154 Fundal Height: 31 cm Movement: Present     General:  Alert, oriented and cooperative. Patient is in no acute distress.  Skin: Skin is warm and dry. No rash noted.   Cardiovascular: Normal heart rate noted  Respiratory: Normal respiratory effort, no problems with respiration noted  Abdomen: Soft, gravid, appropriate for gestational age. Pain/Pressure: Present     Pelvic:  Cervical exam deferred        Extremities: Normal range of  motion.     Mental Status: Normal mood and affect. Normal behavior. Normal judgment and thought content.   Assessment   24 y.o. [id number] at [redacted]w[redacted]d by  03/26/2020, by Last Menstrual Period presenting for routine prenatal visit Hx of PTDs Measures S< D   Plan   pregnancy Problems (from 08/21/19 to present)    Problem Noted Resolved   Supervision of high risk pregnancy, antepartum 08/21/2019 by Conard Novak, MD No   Overview Addendum 02/10/2020  6:47 PM by Farrel Conners, CNM    [facility] Prenatal Labs  Dating LMP Blood type: O/Positive/-- (01/15 1552)   Genetic Screen declines Antibody:Negative (01/15 1552)  Anatomic [place] Complete low lying placenta -resolved Rubella: 1.09 (01/15 1552) Varicella: Non immune  GTT Third trimester: 53 RPR: Non Reactive (01/15 1552)   Rhogam  not needed HBsAg: Negative (01/15 1552)   TDaP vaccine     01/22/20                  Flu Shot: HIV: Non Reactive (01/15 1552)   Baby Food    Bottle                            GBS:   Contraception  Tubal ligation Consents signed 11/04/2019 Pap: ASCUS HPV+, 2020  CBB     CS/VBAC    Support Person           Previous Version   History of preterm delivery, currently pregnant 08/21/2019 by Conard Novak, MD No       Preterm labor symptoms and general obstetric precautions including but not limited to vaginal bleeding, contractions, leaking of fluid and fetal movement were reviewed in detail with the patient. Please refer to After Visit Summary for other counseling recommendations.  Growth sono  ordered for next week due to S<Dates.  She continues on pelvic rest. Stressed importance of staying well hydrated.  Return in about 1 week (around 02/26/2020) for return OB.  Mirna Mires, CNM  02/19/2020 10:41 AM

## 2020-02-23 ENCOUNTER — Telehealth: Payer: Self-pay

## 2020-02-23 ENCOUNTER — Other Ambulatory Visit: Payer: Self-pay | Admitting: Obstetrics

## 2020-02-23 DIAGNOSIS — O0990 Supervision of high risk pregnancy, unspecified, unspecified trimester: Secondary | ICD-10-CM

## 2020-02-23 NOTE — Progress Notes (Signed)
ultrasound

## 2020-02-24 ENCOUNTER — Observation Stay
Admission: EM | Admit: 2020-02-24 | Discharge: 2020-02-24 | Disposition: A | Discharge status: Home / Self Care | Payer: Medicaid Other | Attending: Certified Nurse Midwife | Admitting: Certified Nurse Midwife

## 2020-02-24 ENCOUNTER — Encounter: Payer: Self-pay | Admitting: Obstetrics and Gynecology

## 2020-02-24 DIAGNOSIS — R102 Pelvic and perineal pain: Secondary | ICD-10-CM | POA: Diagnosis present

## 2020-02-24 DIAGNOSIS — O26893 Other specified pregnancy related conditions, third trimester: Secondary | ICD-10-CM | POA: Diagnosis not present

## 2020-02-24 DIAGNOSIS — O09893 Supervision of other high risk pregnancies, third trimester: Secondary | ICD-10-CM | POA: Diagnosis not present

## 2020-02-24 DIAGNOSIS — Z3A35 35 weeks gestation of pregnancy: Secondary | ICD-10-CM | POA: Insufficient documentation

## 2020-02-24 NOTE — Discharge Summary (Signed)
Physician Final Progress Note  Patient ID: Sara Mcneil MRN: [id number] DOB/AGE: 29-Jul-1996 23 y.o.  Admit date: 02/24/2020 Admitting provider: Vena Austria, MD/ Gasper Lloyd.Sharen Hones, CNM Discharge date: 02/24/2020   Admission Diagnoses: IUP at 35wk4d with pelvic pain.  Discharge Diagnoses:  Same as above.   Consults: None  Significant Findings/ Diagnostic Studies:    History of Present Illness:  Chief Complaint:  Complains of increasing pelvic pain/back pain since 2030 tonight. HPI:  Sara Mcneil is a 24 y.o. [phone] female with EDC=8/21/2021at [redacted]w[redacted]d dated by LMP=8wk ultrasoundpresenting with increased pelvic pain and back pain since 2030 this evening. The pain is constant and intermittently worsens.   +FM, no vaginal bleeding or leakage of fluid.  Pt has history of two preterm deliveries at 35 weeks and was given 2 doses of BMZ on 7/1 and 7/2 after presenting with spotting and contractions. Her most recent cervical exam was 1/60%/-1 to 0. An ultrasound on 6/18 gave EFW in the 46th% and AFI 9.4cm.   [facility]atal care at [facility] has also been remarkable for the following: [facility] Prenatal Labs  Dating LMP Blood type: O/Positive/-- (01/15 1552)   Genetic Screen declines Antibody:Negative (01/15 1552)  Anatomic [place] Complete low lying placenta -resolved Rubella: 1.09 (01/15 1552) Varicella: Non immune  GTT Third trimester: 53 RPR: Non Reactive (01/15 1552)   Rhogam  not needed HBsAg: Negative (01/15 1552)   TDaP vaccine     01/22/20                  Flu Shot: HIV: Non Reactive (01/15 1552)   Baby Food    Bottle                            GBS:   Contraception  Tubal ligation Consents signed 11/04/2019 Pap: ASCUS HPV+, 2020  CBB     CS/VBAC    Support Person         Past Medical History:  Diagnosis Date  . HPV (human papilloma virus) infection   . PID (acute pelvic inflammatory disease)   . Postpartum care following vaginal delivery 02/18/2018  .  Preterm delivery         Past Surgical History:  Procedure Laterality Date  . NO PAST SURGERIES     Social History  Social History        Socioeconomic History  . Marital status: Significant Other    Spouse name: Not on file  . Number of children: 2  . Years of education: Not on file  . Highest education level: Not on file  Occupational History  . Not on file  Tobacco Use  . Smoking status: Never Smoker  . Smokeless tobacco: Never Used  Vaping Use  . Vaping Use: Never assessed  Substance and Sexual Activity  . Alcohol use: Not Currently  . Drug use: Not Currently  . Sexual activity: Yes    Birth control/protection: none    Comment:  Other Topics Concern  . Not on file  Social History Narrative  . Not on file   Social Determinants of Health      Financial Resource Strain:   . Difficulty of Paying Living Expenses:   Food Insecurity:   . Worried About Programme researcher, broadcasting/film/video in the Last Year:   . Barista in the Last Year:   Transportation Needs:   . Freight forwarder (Medical):   Marland Kitchen Lack of Transportation (  Non-Medical):   Physical Activity:   . Days of Exercise per Week:   . Minutes of Exercise per Session:   Stress:   . Feeling of Stress :   Social Connections:   . Frequency of Communication with Friends and Family:   . Frequency of Social Gatherings with Friends and Family:   . Attends Religious Services:   . Active Member of Clubs or Organizations:   . Attends Banker Meetings:   Marland Kitchen Marital Status:   Intimate Partner Violence:   . Fear of Current or Ex-Partner:   . Emotionally Abused:   Marland Kitchen Physically Abused:   . Sexually Abused:       Family history: negative for breast and ovarian cancer  Exam: Vital Signs: BP 108/66   Pulse 74   Temp 98.2 F (36.8 C) (Oral)   Resp 16   Ht 5\' 8"  (1.727 m)   Wt 68.5 kg   LMP 06/20/2019   SpO2 99%   BMI 22.96 kg/m   General: gravid WF, tearful at time Chest/  Respiratory: normal respiratory effort Abdomen: gravid, soft, NT SSE: External/BUS: dry, no lesions Cervix: 1/60-70%/0 extremely posterior Ultrasound: cephalic presentation, OP, anterior placenta, subjectively normal fluid. Good FM   FHR: 145 baseline with accelerations to 170s to 180, moderate variability Toco: irritability mostly, palpated one contractions after her vaginal exam  A: IUP at 35wk4d with no evidence of labor Pelvic pain probably due to fetal position and station  P: Discharge home with labor precautions and wih some suggestions for positions/stretches to try to relieve discomfort. Follow up as scheduled on 02/26/2020 at office.  Procedures: none  Discharge Condition: stable  Disposition: Discharge disposition: 01-Home or Self Care       Diet: Regular diet  Discharge Activity: Activity as tolerated   Allergies as of 02/24/2020      Reactions   Sulfa Antibiotics Hives   Iodine    Other Hives   Itching and small hives s/p iodine injection      Medication List    TAKE these medications   ondansetron 4 MG disintegrating tablet Commonly known as: Zofran ODT Take 1 tablet (4 mg total) by mouth every 8 (eight) hours as needed for nausea or vomiting.   Prenatal Adult Gummy/DHA/FA 0.4-25 MG Chew Chew 2 tablets by mouth daily.        Total time spent taking care of this patient: 15-20 minutes  Signed: 03[birth year]-1985 02/24/2020, 11:45 PM

## 2020-02-24 NOTE — OB Triage Note (Signed)
Pt is G4P2 presents at 41.4weeks with increased pelvic pressure since 2030. Pt has hx PTD x 2 at approx 35 weeks. +FM, no LOF or VB

## 2020-02-25 NOTE — Final Progress Note (Signed)
Physician Final Progress Note  Patient ID: Sara Mcneil MRN: [id number] DOB/AGE: Nov 16, 1995 24 y.o.  Admit date: 02/05/2020 Admitting provider: Conard Novak, MD Discharge date: 02/05/2020  Admission Diagnoses: IUP at 32wk6d with preterm dilation S/p vaginal bleeding yesterday History of preterm delivery x 2  Discharge Diagnoses:  Same as above.  Consults: None  Significant Findings/ Diagnostic Studies: 24 y.o. [id number] at [redacted]w[redacted]d by Estimated Date of Delivery: 03/26/20 presented yesterday with light pink spotting she noted after voiding.  No LOF, +FM, no ctx.  Pregnancy notable for low lying placenta, resolved on follow up imaging, and history of 2 prior 35 week deliveries.    On presentation cervix 1/50/ballotable.  GC/CT negative.  No change on 2-hr recheck.  UA negative.  Patient received dose 1 of BMZ yesterday and she returns today only for her second dose of betamethasone. She reports no further spotting and no regular contractions.  Procedures: Betamethasone 12.5 mgm x 1 was administered by the RN.  Discharge Condition: stable  Disposition:  There are no questions and answers to display.        Diet: Regular diet  Discharge Activity: Activity as tolerated   Allergies as of 02/05/2020      Reactions   Sulfa Antibiotics Hives   Iodine    Other Hives   Itching and small hives s/p iodine injection      Medication List    ASK your doctor about these medications   ondansetron 4 MG disintegrating tablet Commonly known as: Zofran ODT Take 1 tablet (4 mg total) by mouth every 8 (eight) hours as needed for nausea or vomiting.   Prenatal Adult Gummy/DHA/FA 0.4-25 MG Chew Chew 2 tablets by mouth daily.          Signed: Farrel Conners 02/25/2020, 6:53 PM

## 2020-02-26 ENCOUNTER — Encounter: Payer: Self-pay | Admitting: Advanced Practice Midwife

## 2020-02-26 ENCOUNTER — Other Ambulatory Visit (HOSPITAL_COMMUNITY)
Admission: RE | Admit: 2020-02-26 | Discharge: 2020-02-26 | Disposition: A | Discharge status: Home / Self Care | Payer: Medicaid Other | Source: Ambulatory Visit | Attending: Advanced Practice Midwife | Admitting: Advanced Practice Midwife

## 2020-02-26 ENCOUNTER — Ambulatory Visit (INDEPENDENT_AMBULATORY_CARE_PROVIDER_SITE_OTHER): Payer: Medicaid Other

## 2020-02-26 ENCOUNTER — Ambulatory Visit (INDEPENDENT_AMBULATORY_CARE_PROVIDER_SITE_OTHER): Payer: Medicaid Other | Admitting: Advanced Practice Midwife

## 2020-02-26 ENCOUNTER — Other Ambulatory Visit: Payer: Self-pay

## 2020-02-26 ENCOUNTER — Other Ambulatory Visit: Payer: Self-pay | Admitting: Obstetrics

## 2020-02-26 VITALS — BP 120/70 | Ht 68.0 in | Wt 152.2 lb

## 2020-02-26 DIAGNOSIS — O0993 Supervision of high risk pregnancy, unspecified, third trimester: Secondary | ICD-10-CM | POA: Diagnosis present

## 2020-02-26 DIAGNOSIS — Z113 Encounter for screening for infections with a predominantly sexual mode of transmission: Secondary | ICD-10-CM | POA: Insufficient documentation

## 2020-02-26 DIAGNOSIS — Z3A35 35 weeks gestation of pregnancy: Secondary | ICD-10-CM | POA: Diagnosis present

## 2020-02-26 DIAGNOSIS — O0990 Supervision of high risk pregnancy, unspecified, unspecified trimester: Secondary | ICD-10-CM

## 2020-02-26 DIAGNOSIS — O09893 Supervision of other high risk pregnancies, third trimester: Secondary | ICD-10-CM

## 2020-02-26 DIAGNOSIS — O09899 Supervision of other high risk pregnancies, unspecified trimester: Secondary | ICD-10-CM

## 2020-02-26 DIAGNOSIS — Z3685 Encounter for antenatal screening for Streptococcus B: Secondary | ICD-10-CM

## 2020-02-26 LAB — POCT URINALYSIS DIPSTICK OB
Glucose, UA: NEGATIVE
POC,PROTEIN,UA: NEGATIVE

## 2020-02-26 LAB — OB RESULTS CONSOLE GC/CHLAMYDIA: Gonorrhea: NEGATIVE

## 2020-02-26 NOTE — Progress Notes (Signed)
Routine Prenatal Care Visit  Subjective  Sara Mcneil is a 24 y.o. [phone] at [redacted]w[redacted]d being seen today for ongoing prenatal care.  She is currently monitored for the following issues for this high-risk pregnancy and has Supervision of high risk pregnancy, antepartum; History of preterm delivery, currently pregnant; Abdominal trauma; Rubella non-immune status, antepartum; Maternal iron deficiency anemia affecting pregnancy in third trimester, antepartum; Low maternal serum vitamin B12; Frequent UTI; Anxiety; Vaginal bleeding in pregnancy, third trimester; Indication for care in labor and delivery, antepartum; and Pelvic pain in pregnancy, antepartum, third trimester on their problem list.  ----------------------------------------------------------------------------------- Patient reports back pain due to OP baby diagnosed at recent hospital visit. She has been given information on comfort measures/position changes.   Contractions: Irregular. Vag. Bleeding: None.  Movement: Present. Leaking Fluid denies.  ----------------------------------------------------------------------------------- The following portions of the patient's history were reviewed and updated as appropriate: allergies, current medications, past family history, past medical history, past social history, past surgical history and problem list. Problem list updated.  Objective  Blood pressure 120/70, height 5\' 8"  (1.727 m), weight 152 lb 3.2 oz (69 kg), last menstrual period 06/20/2019, unknown if currently breastfeeding. Pregravid weight 116 lb (52.6 kg) Total Weight Gain 36 lb 3.2 oz (16.4 kg) Urinalysis: Urine Protein    Urine Glucose    Fetal Status: Fetal Heart Rate (bpm): 142 Fundal Height: 31 cm Movement: Present     General:  Alert, oriented and cooperative. Patient is in no acute distress.  Skin: Skin is warm and dry. No rash noted.   Cardiovascular: Normal heart rate noted  Respiratory: Normal respiratory effort, no  problems with respiration noted  Abdomen: Soft, gravid, appropriate for gestational age. Pain/Pressure: Present     Pelvic:  GBS/aptima collected        Extremities: Normal range of motion.  Edema: None  Mental Status: Normal mood and affect. Normal behavior. Normal judgment and thought content.   Assessment   24 y.o. 30 at [redacted]w[redacted]d by  03/26/2020, by Last Menstrual Period presenting for routine prenatal visit  Plan   pregnancy Problems (from 08/21/19 to present)    Problem Noted Resolved   Supervision of high risk pregnancy, antepartum 08/21/2019 by 08/23/2019, MD No   Overview Addendum 02/10/2020  6:47 PM by 04/12/2020, CNM    [facility] Prenatal Labs  Dating LMP Blood type: O/Positive/-- (01/15 1552)   Genetic Screen declines Antibody:Negative (01/15 1552)  Anatomic 09-13-1984 Complete low lying placenta -resolved Rubella: 1.09 (01/15 1552) Varicella: Non immune  GTT Third trimester: 53 RPR: Non Reactive (01/15 1552)   Rhogam  not needed HBsAg: Negative (01/15 1552)   TDaP vaccine     01/22/20                  Flu Shot: HIV: Non Reactive (01/15 1552)   Baby Food    Bottle                            GBS:   Contraception  Tubal ligation Consents signed 11/04/2019 Pap: ASCUS HPV+, 2020  CBB     CS/VBAC    Support Person           Previous Version   History of preterm delivery, currently pregnant 08/21/2019 by 08/23/2019, MD No       Preterm labor symptoms and general obstetric precautions including but not limited to vaginal bleeding, contractions, leaking of fluid and fetal  movement were reviewed in detail with the patient.   Return in about 1 week (around 03/04/2020) for rob.  Tresea Mall, CNM 02/26/2020 4:21 PM

## 2020-02-28 LAB — STREP GP B NAA: Strep Gp B NAA: NEGATIVE

## 2020-03-01 LAB — CERVICOVAGINAL ANCILLARY ONLY
Chlamydia: NEGATIVE
Comment: NEGATIVE
Comment: NEGATIVE
Comment: NORMAL
Neisseria Gonorrhea: NEGATIVE
Trichomonas: NEGATIVE

## 2020-03-04 ENCOUNTER — Other Ambulatory Visit: Payer: Self-pay

## 2020-03-04 ENCOUNTER — Ambulatory Visit (INDEPENDENT_AMBULATORY_CARE_PROVIDER_SITE_OTHER): Payer: Medicaid Other | Admitting: Certified Nurse Midwife

## 2020-03-04 VITALS — BP 100/66 | Wt 154.0 lb

## 2020-03-04 DIAGNOSIS — O368130 Decreased fetal movements, third trimester, not applicable or unspecified: Secondary | ICD-10-CM

## 2020-03-04 DIAGNOSIS — O0993 Supervision of high risk pregnancy, unspecified, third trimester: Secondary | ICD-10-CM

## 2020-03-04 DIAGNOSIS — Z3689 Encounter for other specified antenatal screening: Secondary | ICD-10-CM

## 2020-03-04 DIAGNOSIS — Z3A36 36 weeks gestation of pregnancy: Secondary | ICD-10-CM

## 2020-03-04 DIAGNOSIS — O0990 Supervision of high risk pregnancy, unspecified, unspecified trimester: Secondary | ICD-10-CM

## 2020-03-04 LAB — POCT URINALYSIS DIPSTICK OB
Glucose, UA: NEGATIVE
POC,PROTEIN,UA: NEGATIVE

## 2020-03-04 NOTE — Progress Notes (Signed)
C/o ctxs have gotten stronger this week; irreg; baby not moving normally.rj

## 2020-03-05 NOTE — Progress Notes (Signed)
ROB at 36wk6days: Having episodes of strong contractions, less often today. No vaginal bleeding. Some vaginal discharge- Concerned about decreased fetal movement today.  On 7/23 EFW 5#13oz and AFI 9.5cm.  Exam: BP 100/66. WT 154# (TWG 38#), negative proteinuria Cervix: 3cm/60-70%/-1/posterior/ soft  NST: baseline 145 with accelerations to 160s, moderate variability (baby having hiccups)  A: IUP at 36wk6d S<D, but normal growth on recent ultrasound Reactive NST  P:ROB/ NST in 4 days LAbor precautions Recommended doing FKCs BID after meals.  Farrel Conners, CNM

## 2020-03-08 ENCOUNTER — Other Ambulatory Visit: Payer: Self-pay

## 2020-03-08 ENCOUNTER — Ambulatory Visit (INDEPENDENT_AMBULATORY_CARE_PROVIDER_SITE_OTHER): Payer: Medicaid Other | Admitting: Certified Nurse Midwife

## 2020-03-08 VITALS — BP 100/70 | Wt 154.0 lb

## 2020-03-08 DIAGNOSIS — O0993 Supervision of high risk pregnancy, unspecified, third trimester: Secondary | ICD-10-CM | POA: Diagnosis not present

## 2020-03-08 DIAGNOSIS — Z3689 Encounter for other specified antenatal screening: Secondary | ICD-10-CM

## 2020-03-08 DIAGNOSIS — O0990 Supervision of high risk pregnancy, unspecified, unspecified trimester: Secondary | ICD-10-CM

## 2020-03-08 DIAGNOSIS — Z3A37 37 weeks gestation of pregnancy: Secondary | ICD-10-CM

## 2020-03-08 NOTE — Progress Notes (Signed)
C/o didn't sleep well last night d/t acid reflux and ctxs - didn't count them.  While in the pool yesterday had a sudden urge to pee - when she did pee it was clear with stuff floating in it, went 4 times in an hour. rj

## 2020-03-11 ENCOUNTER — Encounter: Payer: Self-pay | Admitting: Obstetrics and Gynecology

## 2020-03-11 ENCOUNTER — Other Ambulatory Visit: Payer: Self-pay

## 2020-03-11 ENCOUNTER — Observation Stay
Admission: EM | Admit: 2020-03-11 | Discharge: 2020-03-11 | Disposition: A | Discharge status: Home / Self Care | Payer: Medicaid Other | Attending: Obstetrics and Gynecology | Admitting: Obstetrics and Gynecology

## 2020-03-11 DIAGNOSIS — O0990 Supervision of high risk pregnancy, unspecified, unspecified trimester: Secondary | ICD-10-CM

## 2020-03-11 DIAGNOSIS — Z3A37 37 weeks gestation of pregnancy: Secondary | ICD-10-CM

## 2020-03-11 DIAGNOSIS — O09213 Supervision of pregnancy with history of pre-term labor, third trimester: Secondary | ICD-10-CM | POA: Diagnosis not present

## 2020-03-11 DIAGNOSIS — O471 False labor at or after 37 completed weeks of gestation: Secondary | ICD-10-CM | POA: Diagnosis not present

## 2020-03-11 DIAGNOSIS — Z3A38 38 weeks gestation of pregnancy: Secondary | ICD-10-CM | POA: Diagnosis not present

## 2020-03-11 DIAGNOSIS — O479 False labor, unspecified: Secondary | ICD-10-CM

## 2020-03-11 DIAGNOSIS — O09899 Supervision of other high risk pregnancies, unspecified trimester: Secondary | ICD-10-CM

## 2020-03-11 MED ORDER — ACETAMINOPHEN 325 MG PO TABS
650.0000 mg | ORAL_TABLET | ORAL | Status: DC | PRN
  Administered 2020-03-11: 650 mg via ORAL
  Filled 2020-03-11: qty 2

## 2020-03-11 NOTE — Progress Notes (Signed)
Discharge instructions reviewed. Pt  Verbalized understanding. Labor precautions reviewed. No further needs at this time.

## 2020-03-11 NOTE — OB Triage Note (Signed)
Pt is a G4P2 and [redacted]w[redacted]d presenting to L&D with c/o ctx every 3-4 min since 1800 tonight. Pt denies VB or LOF. Pt confirms positive fetal movement. VSS. Monitors applied and assessing. No further needs at this time.

## 2020-03-11 NOTE — Discharge Summary (Signed)
Physician Final Progress Note  Patient ID: CHERICA HEIDEN MRN: [id number] DOB/AGE: 09-13-1995 24 y.o.  Admit date: 03/11/2020 Admitting provider: Vena Austria, MD Discharge date: 03/11/2020   Admission Diagnoses: Contractions  Discharge Diagnoses:  Active Problems:   Labor and delivery indication for care or intervention Braxton Hicks contractions  24 y.o. [id number] at [redacted]w[redacted]d by Estimated Date of Delivery: 03/26/20 presenting with contractions over the past 4-hrs.  Pregnancy uncomplicated to date other than prior pregnancy history of preterm labor.  She reports presistant contractions, +FM, no LOF, no VB.  Cervix noted to be unchanged from clnic check 5 days ago on presentation.  Tocometer showing irregular contraction about every 8-10 minutes.  Blood pressure 115/77, pulse 88, temperature 98.5 F (36.9 C), temperature source Oral, resp. rate 16, height 5\' 8"  (1.727 m), weight 69.9 kg, last menstrual period 06/20/2019, unknown if currently breastfeeding.  pregnancy Problems (from 08/21/19 to present)    Problem Noted Resolved   Supervision of high risk pregnancy, antepartum 08/21/2019 by 08/23/2019, MD No   Overview Addendum 02/10/2020  6:47 PM by 04/12/2020, CNM    [facility] Prenatal Labs  Dating LMP Blood type: O/Positive/-- (01/15 1552)   Genetic Screen declines Antibody:Negative (01/15 1552)  Anatomic 09-13-1984 Complete low lying placenta -resolved Rubella: 1.09 (01/15 1552) Varicella: Non immune  GTT Third trimester: 53 RPR: Non Reactive (01/15 1552)   Rhogam  not needed HBsAg: Negative (01/15 1552)   TDaP vaccine     01/22/20                  Flu Shot: HIV: Non Reactive (01/15 1552)   Baby Food    Bottle                            GBS:   Contraception  Tubal ligation Consents signed 11/04/2019 Pap: ASCUS HPV+, 2020  CBB     CS/VBAC    Support Person           Previous Version   History of preterm delivery, currently pregnant 08/21/2019 by 08/23/2019, MD No      Consults: None  Significant Findings/ Diagnostic Studies: none  Procedures:  Baseline: 125 Variability: moderate Accelerations: present Decelerations: present Tocometry: irregular, every 8-10 minutes The patient was monitored for 30 minutes, fetal heart rate tracing was deemed reactive, category I tracing,  CPT [phone]   Discharge Condition: good  Disposition: Discharge disposition: 01-Home or Self Care       Diet: Regular diet  Discharge Activity: Activity as tolerated  Discharge Instructions    Discharge activity:  No Restrictions   Complete by: As directed    Discharge diet:  No restrictions   Complete by: As directed    Fetal Kick Count:  Lie on our left side for one hour after a meal, and count the number of times your baby kicks.  If it is less than 5 times, get up, move around and drink some juice.  Repeat the test 30 minutes later.  If it is still less than 5 kicks in an hour, notify your doctor.   Complete by: As directed    LABOR:  When conractions begin, you should start to time them from the beginning of one contraction to the beginning  of the next.  When contractions are 5 - 10 minutes apart or less and have been regular for at least an hour, you should call your  health care provider.   Complete by: As directed    No sexual activity restrictions   Complete by: As directed    Notify physician for bleeding from the vagina   Complete by: As directed    Notify physician for blurring of vision or spots before the eyes   Complete by: As directed    Notify physician for chills or fever   Complete by: As directed    Notify physician for fainting spells, "black outs" or loss of consciousness   Complete by: As directed    Notify physician for increase in vaginal discharge   Complete by: As directed    Notify physician for leaking of fluid   Complete by: As directed    Notify physician for pain or burning when urinating   Complete by: As  directed    Notify physician for pelvic pressure (sudden increase)   Complete by: As directed    Notify physician for severe or continued nausea or vomiting   Complete by: As directed    Notify physician for sudden gushing of fluid from the vagina (with or without continued leaking)   Complete by: As directed    Notify physician for sudden, constant, or occasional abdominal pain   Complete by: As directed    Notify physician if baby moving less than usual   Complete by: As directed      Allergies as of 03/11/2020      Reactions   Sulfa Antibiotics Hives   Iodine    Other Hives   Itching and small hives s/p iodine injection      Medication List    TAKE these medications   ondansetron 4 MG disintegrating tablet Commonly known as: Zofran ODT Take 1 tablet (4 mg total) by mouth every 8 (eight) hours as needed for nausea or vomiting.   Prenatal Adult Gummy/DHA/FA 0.4-25 MG Chew Chew 2 tablets by mouth daily.        Total time spent taking care of this patient: 20 minutes  Signed: Vena Austria 03/11/2020, 11:29 PM

## 2020-03-12 NOTE — Progress Notes (Signed)
ROB at 37wk3d: Continues to have irregular contractions or episodes of regular contractions. No vaginal bleeding. Baby active. Having some heartburn.   BP 100/70 Weight 154# (TWG38#).  NST: baseline 140 with accelerations to 160s, moderate variability Cervix: no change (3/60%/-1 to 0)  A: IUP at 37wk3d Reactive NST  P: RTO 1 week. Labor precautions  Farrel Conners, CNM

## 2020-03-18 ENCOUNTER — Encounter: Payer: Self-pay | Admitting: Obstetrics and Gynecology

## 2020-03-18 ENCOUNTER — Other Ambulatory Visit: Payer: Self-pay

## 2020-03-18 ENCOUNTER — Ambulatory Visit (INDEPENDENT_AMBULATORY_CARE_PROVIDER_SITE_OTHER): Payer: Medicaid Other | Admitting: Obstetrics and Gynecology

## 2020-03-18 VITALS — BP 118/74 | Wt 156.0 lb

## 2020-03-18 DIAGNOSIS — O09899 Supervision of other high risk pregnancies, unspecified trimester: Secondary | ICD-10-CM

## 2020-03-18 DIAGNOSIS — Z3A38 38 weeks gestation of pregnancy: Secondary | ICD-10-CM

## 2020-03-18 DIAGNOSIS — O99013 Anemia complicating pregnancy, third trimester: Secondary | ICD-10-CM

## 2020-03-18 DIAGNOSIS — D509 Iron deficiency anemia, unspecified: Secondary | ICD-10-CM

## 2020-03-18 DIAGNOSIS — O0993 Supervision of high risk pregnancy, unspecified, third trimester: Secondary | ICD-10-CM

## 2020-03-18 NOTE — Progress Notes (Signed)
Routine Prenatal Care Visit  Subjective  Sara Mcneil is a 24 y.o. [phone] at [redacted]w[redacted]d being seen today for ongoing prenatal care.  She is currently monitored for the following issues for this high-risk pregnancy and has Supervision of high risk pregnancy, antepartum; History of preterm delivery, currently pregnant; Abdominal trauma; Rubella non-immune status, antepartum; Maternal iron deficiency anemia affecting pregnancy in third trimester, antepartum; Low maternal serum vitamin B12; Frequent UTI; Anxiety; Vaginal bleeding in pregnancy, third trimester; Indication for care in labor and delivery, antepartum; Pelvic pain in pregnancy, antepartum, third trimester; Labor and delivery indication for care or intervention; and Braxton Hicks contractions on their problem list.  ----------------------------------------------------------------------------------- Patient reports no complaints.   Contractions: Irregular. Vag. Bleeding: None.  Movement: Present. Leaking Fluid denies.  ----------------------------------------------------------------------------------- The following portions of the patient's history were reviewed and updated as appropriate: allergies, current medications, past family history, past medical history, past social history, past surgical history and problem list. Problem list updated.  Objective  Blood pressure 118/74, weight 156 lb (70.8 kg), last menstrual period 06/20/2019, unknown if currently breastfeeding. Pregravid weight 116 lb (52.6 kg) Total Weight Gain 40 lb (18.1 kg) Urinalysis: Urine Protein    Urine Glucose    Fetal Status: Fetal Heart Rate (bpm): 150 Fundal Height: 36 cm Movement: Present  Presentation: Vertex  General:  Alert, oriented and cooperative. Patient is in no acute distress.  Skin: Skin is warm and dry. No rash noted.   Cardiovascular: Normal heart rate noted  Respiratory: Normal respiratory effort, no problems with respiration noted  Abdomen: Soft,  gravid, appropriate for gestational age. Pain/Pressure: Present     Pelvic:  Cervical exam performed Dilation: 4 Effacement (%): 50 Station: -1  Extremities: Normal range of motion.     Mental Status: Normal mood and affect. Normal behavior. Normal judgment and thought content.   Assessment   24 y.o. [id number] at [redacted]w[redacted]d by  03/26/2020, by Last Menstrual Period presenting for routine prenatal visit  Plan   pregnancy Problems (from 08/21/19 to present)    Problem Noted Resolved   Supervision of high risk pregnancy, antepartum 08/21/2019 by Conard Novak, MD No   Overview Addendum 02/10/2020  6:47 PM by Farrel Conners, CNM    [facility] Prenatal Labs  Dating LMP Blood type: O/Positive/-- (01/15 1552)   Genetic Screen declines Antibody:Negative (01/15 1552)  Anatomic [place] Complete low lying placenta -resolved Rubella: 1.09 (01/15 1552) Varicella: Non immune  GTT Third trimester: 53 RPR: Non Reactive (01/15 1552)   Rhogam  not needed HBsAg: Negative (01/15 1552)   TDaP vaccine     01/22/20                  Flu Shot: HIV: Non Reactive (01/15 1552)   Baby Food    Bottle                            GBS:   Contraception  Tubal ligation Consents signed 11/04/2019 Pap: ASCUS HPV+, 2020  CBB     CS/VBAC    Support Person         Previous Version   History of preterm delivery, currently pregnant 08/21/2019 by Conard Novak, MD No       Term labor symptoms and general obstetric precautions including but not limited to vaginal bleeding, contractions, leaking of fluid and fetal movement were reviewed in detail with the patient. Please refer to After Visit Summary for other  counseling recommendations.   - IOL for 8/14 (tomorrow) per patient preference.  L&D approved.   Return if symptoms worsen or fail to improve.  Thomasene Mohair, MD, [facility], [facility] 03/18/2020 12:20 PM

## 2020-03-19 ENCOUNTER — Other Ambulatory Visit: Payer: Self-pay

## 2020-03-19 ENCOUNTER — Inpatient Hospital Stay
Admission: EM | Admit: 2020-03-19 | Discharge: 2020-03-21 | DRG: 797 | Disposition: A | Discharge status: Home / Self Care | Payer: Medicaid Other | Attending: Obstetrics and Gynecology | Admitting: Obstetrics and Gynecology

## 2020-03-19 ENCOUNTER — Encounter: Payer: Self-pay | Admitting: Obstetrics and Gynecology

## 2020-03-19 DIAGNOSIS — O99344 Other mental disorders complicating childbirth: Secondary | ICD-10-CM | POA: Diagnosis present

## 2020-03-19 DIAGNOSIS — D62 Acute posthemorrhagic anemia: Secondary | ICD-10-CM | POA: Diagnosis not present

## 2020-03-19 DIAGNOSIS — O9081 Anemia of the puerperium: Secondary | ICD-10-CM | POA: Diagnosis not present

## 2020-03-19 DIAGNOSIS — Z2839 Other underimmunization status: Secondary | ICD-10-CM

## 2020-03-19 DIAGNOSIS — Z3490 Encounter for supervision of normal pregnancy, unspecified, unspecified trimester: Secondary | ICD-10-CM | POA: Diagnosis present

## 2020-03-19 DIAGNOSIS — Z302 Encounter for sterilization: Secondary | ICD-10-CM

## 2020-03-19 DIAGNOSIS — O09899 Supervision of other high risk pregnancies, unspecified trimester: Secondary | ICD-10-CM

## 2020-03-19 DIAGNOSIS — F419 Anxiety disorder, unspecified: Secondary | ICD-10-CM | POA: Diagnosis present

## 2020-03-19 DIAGNOSIS — Z20822 Contact with and (suspected) exposure to covid-19: Secondary | ICD-10-CM | POA: Diagnosis present

## 2020-03-19 DIAGNOSIS — D509 Iron deficiency anemia, unspecified: Secondary | ICD-10-CM | POA: Diagnosis present

## 2020-03-19 DIAGNOSIS — O26893 Other specified pregnancy related conditions, third trimester: Secondary | ICD-10-CM | POA: Diagnosis present

## 2020-03-19 DIAGNOSIS — O99013 Anemia complicating pregnancy, third trimester: Secondary | ICD-10-CM

## 2020-03-19 DIAGNOSIS — O0990 Supervision of high risk pregnancy, unspecified, unspecified trimester: Secondary | ICD-10-CM

## 2020-03-19 DIAGNOSIS — Z283 Underimmunization status: Secondary | ICD-10-CM

## 2020-03-19 DIAGNOSIS — Z3A39 39 weeks gestation of pregnancy: Secondary | ICD-10-CM

## 2020-03-19 LAB — CBC
HCT: 29.7 % — ABNORMAL LOW (ref 36.0–46.0)
Hemoglobin: 10.4 g/dL — ABNORMAL LOW (ref 12.0–15.0)
MCH: 30.2 pg (ref 26.0–34.0)
MCHC: 35 g/dL (ref 30.0–36.0)
MCV: 86.3 fL (ref 80.0–100.0)
Platelets: 221 10*3/uL (ref 150–400)
RBC: 3.44 MIL/uL — ABNORMAL LOW (ref 3.87–5.11)
RDW: 13.7 % (ref 11.5–15.5)
WBC: 10.6 10*3/uL — ABNORMAL HIGH (ref 4.0–10.5)
nRBC: 0 % (ref 0.0–0.2)

## 2020-03-19 LAB — TYPE AND SCREEN
ABO/RH(D): O POS
Antibody Screen: NEGATIVE

## 2020-03-19 LAB — SARS CORONAVIRUS 2 BY RT PCR (HOSPITAL ORDER, PERFORMED IN ~~LOC~~ HOSPITAL LAB): SARS Coronavirus 2: NEGATIVE

## 2020-03-19 MED ORDER — ONDANSETRON HCL 4 MG/2ML IJ SOLN: 4.0000 mg | Freq: Four times a day (QID) | INTRAMUSCULAR | Status: DC | PRN

## 2020-03-19 MED ORDER — LACTATED RINGERS IV SOLN
500.0000 mL | INTRAVENOUS | Status: DC | PRN
  Administered 2020-03-19: 500 mL via INTRAVENOUS

## 2020-03-19 MED ORDER — OXYTOCIN-SODIUM CHLORIDE 30-0.9 UT/500ML-% IV SOLN
INTRAVENOUS | Status: AC
  Administered 2020-03-19: 41.7 mL
  Filled 2020-03-19: qty 500

## 2020-03-19 MED ORDER — ACETAMINOPHEN 325 MG PO TABS
650.0000 mg | ORAL_TABLET | ORAL | Status: DC | PRN
  Administered 2020-03-19 – 2020-03-20 (×2): 650 mg via ORAL
  Filled 2020-03-19 (×2): qty 2

## 2020-03-19 MED ORDER — PRENATAL MULTIVITAMIN CH: 1.0000 | ORAL_TABLET | Freq: Every day | ORAL | Status: DC

## 2020-03-19 MED ORDER — BENZOCAINE-MENTHOL 20-0.5 % EX AERO
1.0000 "application " | INHALATION_SPRAY | CUTANEOUS | Status: DC | PRN
  Administered 2020-03-19: 1 via TOPICAL
  Filled 2020-03-19: qty 56

## 2020-03-19 MED ORDER — FERROUS SULFATE 325 (65 FE) MG PO TABS
325.0000 mg | ORAL_TABLET | Freq: Two times a day (BID) | ORAL | Status: DC
  Administered 2020-03-20 – 2020-03-21 (×2): 325 mg via ORAL
  Filled 2020-03-19 (×2): qty 1

## 2020-03-19 MED ORDER — FENTANYL 2.5 MCG/ML W/ROPIVACAINE 0.15% IN NS 100 ML EPIDURAL (ARMC)
EPIDURAL | Status: AC
  Filled 2020-03-19: qty 100

## 2020-03-19 MED ORDER — SENNOSIDES-DOCUSATE SODIUM 8.6-50 MG PO TABS
2.0000 | ORAL_TABLET | ORAL | Status: DC
  Administered 2020-03-21: 2 via ORAL
  Filled 2020-03-19: qty 2

## 2020-03-19 MED ORDER — DIBUCAINE (PERIANAL) 1 % EX OINT: 1.0000 "application " | TOPICAL_OINTMENT | CUTANEOUS | Status: DC | PRN

## 2020-03-19 MED ORDER — ONDANSETRON HCL 4 MG PO TABS: 4.0000 mg | ORAL_TABLET | ORAL | Status: DC | PRN

## 2020-03-19 MED ORDER — WITCH HAZEL-GLYCERIN EX PADS: 1.0000 "application " | MEDICATED_PAD | CUTANEOUS | Status: DC | PRN

## 2020-03-19 MED ORDER — COCONUT OIL OIL
1.0000 "application " | TOPICAL_OIL | Status: DC | PRN
  Administered 2020-03-20: 1 via TOPICAL
  Filled 2020-03-19: qty 120

## 2020-03-19 MED ORDER — HYDROMORPHONE HCL 1 MG/ML IJ SOLN
0.5000 mg | Freq: Once | INTRAMUSCULAR | Status: AC
  Administered 2020-03-19: 0.5 mg via INTRAVENOUS
  Filled 2020-03-19: qty 1

## 2020-03-19 MED ORDER — AMMONIA AROMATIC IN INHA
RESPIRATORY_TRACT | Status: AC
  Filled 2020-03-19: qty 10

## 2020-03-19 MED ORDER — VARICELLA VIRUS VACCINE LIVE 1350 PFU/0.5ML IJ SUSR
0.5000 mL | INTRAMUSCULAR | Status: DC | PRN
  Filled 2020-03-19: qty 0.5

## 2020-03-19 MED ORDER — IBUPROFEN 600 MG PO TABS
600.0000 mg | ORAL_TABLET | Freq: Four times a day (QID) | ORAL | Status: DC
  Administered 2020-03-19 – 2020-03-21 (×5): 600 mg via ORAL
  Filled 2020-03-19 (×7): qty 1

## 2020-03-19 MED ORDER — LACTATED RINGERS IV SOLN: INTRAVENOUS | Status: DC

## 2020-03-19 MED ORDER — FAMOTIDINE 40 MG/5ML PO SUSR
20.0000 mg | Freq: Two times a day (BID) | ORAL | Status: DC | PRN
  Administered 2020-03-19: 20 mg via ORAL
  Filled 2020-03-19 (×2): qty 2.5

## 2020-03-19 MED ORDER — DIPHENHYDRAMINE HCL 25 MG PO CAPS: 25.0000 mg | ORAL_CAPSULE | Freq: Four times a day (QID) | ORAL | Status: DC | PRN

## 2020-03-19 MED ORDER — OXYTOCIN-SODIUM CHLORIDE 30-0.9 UT/500ML-% IV SOLN: 2.5000 [IU]/h | INTRAVENOUS | Status: DC

## 2020-03-19 MED ORDER — LIDOCAINE HCL (PF) 1 % IJ SOLN
30.0000 mL | INTRAMUSCULAR | Status: DC | PRN
  Filled 2020-03-19: qty 30

## 2020-03-19 MED ORDER — OXYTOCIN 10 UNIT/ML IJ SOLN
INTRAMUSCULAR | Status: AC
  Filled 2020-03-19: qty 2

## 2020-03-19 MED ORDER — OXYTOCIN BOLUS FROM INFUSION
333.0000 mL | Freq: Once | INTRAVENOUS | Status: AC
  Administered 2020-03-19: 333 mL via INTRAVENOUS

## 2020-03-19 MED ORDER — TERBUTALINE SULFATE 1 MG/ML IJ SOLN: 0.2500 mg | Freq: Once | INTRAMUSCULAR | Status: DC | PRN

## 2020-03-19 MED ORDER — ONDANSETRON HCL 4 MG/2ML IJ SOLN: 4.0000 mg | INTRAMUSCULAR | Status: DC | PRN

## 2020-03-19 MED ORDER — OXYTOCIN-SODIUM CHLORIDE 30-0.9 UT/500ML-% IV SOLN
1.0000 m[IU]/min | INTRAVENOUS | Status: DC
  Administered 2020-03-19: 1 m[IU]/min via INTRAVENOUS
  Filled 2020-03-19: qty 500

## 2020-03-19 MED ORDER — OXYTOCIN 10 UNIT/ML IJ SOLN
10.0000 [IU] | Freq: Once | INTRAMUSCULAR | Status: DC
  Filled 2020-03-19: qty 1

## 2020-03-19 MED ORDER — MISOPROSTOL 200 MCG PO TABS
ORAL_TABLET | ORAL | Status: AC
  Filled 2020-03-19: qty 4

## 2020-03-19 MED ORDER — SIMETHICONE 80 MG PO CHEW: 80.0000 mg | CHEWABLE_TABLET | ORAL | Status: DC | PRN

## 2020-03-19 NOTE — Discharge Summary (Signed)
Postpartum Discharge Summary  Patient Name: Sara Mcneil DOB: 10/12/1995 MRN: [id number]  Date of admission: 03/19/2020 Delivery date:03/19/2020  Delivering provider: Prentice Docker D  Date of discharge: 03/21/2020  Admitting diagnosis: Encounter for elective induction of labor [Z34.90] Intrauterine pregnancy: [redacted]w[redacted]d    Secondary diagnosis:  Active Problems:   Supervision of high risk pregnancy, antepartum   History of preterm delivery, currently pregnant   Maternal iron deficiency anemia affecting pregnancy in third trimester, antepartum   Susceptible to varicella (non-immune), currently pregnant   Encounter for sterilization  Additional problems: none    Discharge diagnosis: Term Pregnancy Delivered, status post postpartum bilateral tubal ligation                               Post partum procedures:postpartum tubal ligation Augmentation: Pitocin Complications: None  Hospital course: Induction of Labor With Vaginal Delivery   24y.o. yo [id number] 24w0das admitted to the hospital 03/19/2020 for induction of labor.  Indication for induction: Favorable cervix at term and Elective.  Patient had an uncomplicated labor course as follows: Membrane Rupture Time/Date: 6:56 PM ,03/19/2020   Delivery Method:Vaginal, Spontaneous  Episiotomy: None  Lacerations:  Perineal  Details of delivery can be found in separate delivery note.  Patient had a routine postpartum course. On postpartum day #1 she underwent a bilateral tubal ligation under general anesthesia, which occurred without incident.  Patient is discharged home 03/21/20 tolerating a regular diet, ambulating without assistance and voiding without difficulty.  Newborn Data: Birth date:03/19/2020  Birth time:7:26 PM  Gender:Female  "[name]" Living status:Living  Apgars:9 ,9  Weight:3360 g   Magnesium Sulfate received: No BMZ received: No Rhophylac:N/A MMR:No T-DaP: given prenatally on 01/22/20 Transfusion:No  Physical  exam  Vitals:   03/20/20 1413 03/20/20 1610 03/20/20 2338 03/21/20 0825  BP: 120/85 115/62 (!) 97/58 (!) 103/59  Pulse: (!) 56 77 72 60  Resp: '18 18 20 18  ' Temp: 98.1 F (36.7 C) 98.4 F (36.9 C) 98.1 F (36.7 C) 98 F (36.7 C)  TempSrc: Oral Oral Oral   SpO2: 99% 99% 100% 99%  Weight:      Height:       General: alert, cooperative and no distress Lochia: appropriate Uterine Fundus: firm Incision: Healing well with no significant drainage DVT Evaluation: No evidence of DVT seen on physical exam. Labs: Lab Results  Component Value Date   WBC 9.1 03/21/2020   HGB 9.4 (L) 03/21/2020   HCT 29.0 (L) 03/21/2020   MCV 92.4 03/21/2020   PLT 212 03/21/2020   CMP Latest Ref Rng & Units 11/05/2019  Glucose 70 - 99 mg/dL 97  BUN 6 - 20 mg/dL 11  Creatinine 0.44 - 1.00 mg/dL 0.72  Sodium 135 - 145 mmol/L 139  Potassium 3.5 - 5.1 mmol/L 3.8  Chloride 98 - 111 mmol/L 105  CO2 22 - 32 mmol/L 27  Calcium 8.9 - 10.3 mg/dL 9.2  Total Protein 6.5 - 8.1 g/dL 6.8  Total Bilirubin 0.3 - 1.2 mg/dL 0.5  Alkaline Phos 38 - 126 U/L 53  AST 15 - 41 U/L 13(L)  ALT 0 - 44 U/L 9   Edinburgh Score: Edinburgh Postnatal Depression Scale Screening Tool 03/20/2020  I have been able to laugh and see the funny side of things. 0  I have looked forward with enjoyment to things. 2  I have blamed myself unnecessarily when things went wrong.  2  I have been anxious or worried for no good reason. 3  I have felt scared or panicky for no good reason. 1  Things have been getting on top of me. 2  I have been so unhappy that I have had difficulty sleeping. 1  I have felt sad or miserable. 1  I have been so unhappy that I have been crying. 1  The thought of harming myself has occurred to me. 0  Edinburgh Postnatal Depression Scale Total 13      After visit meds:  Allergies as of 03/21/2020      Reactions   Sulfa Antibiotics Hives   Iodine    Other Hives   Itching and small hives s/p iodine injection        Medication List    STOP taking these medications   ondansetron 4 MG disintegrating tablet Commonly known as: Zofran ODT     TAKE these medications   calcium carbonate 500 MG chewable tablet Commonly known as: TUMS - dosed in mg elemental calcium Chew 1 tablet by mouth daily.   ferrous sulfate 325 (65 FE) MG tablet Take 1 tablet (325 mg total) by mouth daily with breakfast.   ibuprofen 600 MG tablet Commonly known as: ADVIL Take 1 tablet (600 mg total) by mouth every 6 (six) hours.   oxyCODONE-acetaminophen 5-325 MG tablet Commonly known as: PERCOCET/ROXICET Take 1 tablet by mouth every 6 (six) hours as needed for up to 3 days for moderate pain or severe pain (pain score 4-7/10).   Prenatal Adult Gummy/DHA/FA 0.4-25 MG Chew Chew 2 tablets by mouth daily.   sertraline 50 MG tablet Commonly known as: Zoloft Take 0.5 tablet x 3 days then increase to 50 mgm daily.        Discharge home in stable condition Infant Feeding: Breast Infant Disposition:home with mother Discharge instruction: per After Visit Summary and Postpartum booklet. Activity: Advance as tolerated. Pelvic rest for 6 weeks.  Diet: routine diet Anticipated Birth Control: BTL done PP Postpartum Appointment:6 weeks  Future Appointments:No future appointments. Follow up Visit:  Follow-up Information    Will Bonnet, MD. Schedule an appointment as soon as possible for a visit in 2 week(s).   Specialty: Obstetrics and Gynecology Why: Postpartum depression check due to elevated EPDS Contact information: [address] [place] [address] [phone]              SIGNED: Dalia Heading, CNM

## 2020-03-19 NOTE — H&P (Signed)
OB History & Physical   History of Present Illness:  Chief Complaint: Here for elective induction of labor  HPI:  Sara Mcneil is a 24 y.o. [phone] female at [redacted]w[redacted]d dated by LMP consistent with an 8 week ultrasound.  Her pregnancy has been complicated by a history of PTD x 2 (declined 17OHP this pregnancy).    She reports contractions.   She denies leakage of fluid.   She denies vaginal bleeding.   She reports fetal movement.    Total weight gain for pregnancy: 18.1 kg   Obstetrical Problem List: pregnancy Problems (from 08/21/19 to present)    Problem Noted Resolved   Supervision of high risk pregnancy, antepartum 08/21/2019 by Conard Novak, MD No   Overview Addendum 02/10/2020  6:47 PM by Farrel Conners, CNM    Clinic [facility] Prenatal Labs  Dating LMP Blood type: O/Positive/-- (01/15 1552)   Genetic Screen declines Antibody:Negative (01/15 1552)  Anatomic [place] Complete low lying placenta -resolved Rubella: 1.09 (01/15 1552) Varicella: Non immune  GTT Third trimester: 53 RPR: Non Reactive (01/15 1552)   Rhogam  not needed HBsAg: Negative (01/15 1552)   TDaP vaccine     01/22/20                  Flu Shot: HIV: Non Reactive (01/15 1552)   Baby Food    Bottle                            GBS:   Contraception  Tubal ligation Consents signed 11/04/2019 Pap: ASCUS HPV+, 2020  CBB     CS/VBAC    Support Person           Previous Version   History of preterm delivery, currently pregnant 08/21/2019 by Conard Novak, MD No       Maternal Medical History:   Past Medical History:  Diagnosis Date  . HPV (human papilloma virus) infection   . PID (acute pelvic inflammatory disease)   . Postpartum care following vaginal delivery 02/18/2018  . Preterm delivery     Past Surgical History:  Procedure Laterality Date  . NO PAST SURGERIES      Allergies  Allergen Reactions  . Sulfa Antibiotics Hives  . Iodine   . Other Hives    Itching and small hives s/p iodine  injection    Prior to Admission medications   Medication Sig Start Date End Date Taking? Authorizing Provider  calcium carbonate (TUMS - DOSED IN MG ELEMENTAL CALCIUM) 500 MG chewable tablet Chew 1 tablet by mouth daily.   Yes [provider]  ondansetron (ZOFRAN ODT) 4 MG disintegrating tablet Take 1 tablet (4 mg total) by mouth every 8 (eight) hours as needed for nausea or vomiting. 07/08/19  Yes Shaune Pollack, MD  Prenatal MV & Min w/FA-DHA (PRENATAL ADULT GUMMY/DHA/FA) 0.4-25 MG CHEW Chew 2 tablets by mouth daily.   Yes [provider]    OB History  Gravida Para Term Preterm AB Living  4 2   2 1 2   SAB TAB Ectopic Multiple Live Births        0 2    # Outcome Date GA Lbr Len/2nd Weight Sex Delivery Anes PTL Lv  4 Current           3 Preterm 02/16/18 [redacted]w[redacted]d 03:08 / 00:12 2690 g M Vag-Forceps EPI  LIV  2 Preterm 09/10/14 [redacted]w[redacted]d  2807 g M Vag-Spont  LIV     Birth Comments: scant prenatal care     Complications: Preterm premature rupture of membranes (PPROM) delivered, current hospitalization  1 AB             Prenatal care site: [facility]/  Social History: She  reports that she has never smoked. She has never used smokeless tobacco. She reports previous alcohol use. She reports previous drug use.  Family History: She denies a family of gynecologic cancers  Review of Systems:  Review of Systems  Constitutional: Negative.   HENT: Negative.   Eyes: Negative.   Respiratory: Negative.   Cardiovascular: Negative.   Gastrointestinal: Negative.   Genitourinary: Negative.   Musculoskeletal: Negative.   Skin: Negative.   Neurological: Negative.   Psychiatric/Behavioral: Negative.      Physical Exam:  BP 124/78   Pulse (!) 106   Temp 98.5 F (36.9 C) (Oral)   Resp 14   Ht 5\' 8"  (1.727 m)   Wt 70.8 kg Comment: 156 lbs  LMP 06/20/2019   BMI 23.72 kg/m   Physical Exam Constitutional:      General: She is not in acute distress.    Appearance:  Normal appearance. She is well-developed.  Genitourinary:     Genitourinary Comments: 4 cm per RN (unchanged from yesterday in clinic)  HENT:     Head: Normocephalic and atraumatic.  Eyes:     General: No scleral icterus.    Conjunctiva/sclera: Conjunctivae normal.  Cardiovascular:     Rate and Rhythm: Normal rate and regular rhythm.     Heart sounds: No murmur heard.  No friction rub. No gallop.   Pulmonary:     Effort: Pulmonary effort is normal. No respiratory distress.     Breath sounds: Normal breath sounds. No wheezing or rales.  Abdominal:     General: Bowel sounds are normal. There is no distension.     Palpations: Abdomen is soft. There is mass (Gravid/NT).     Tenderness: There is no abdominal tenderness. There is no guarding or rebound.  Musculoskeletal:        General: Normal range of motion.     Cervical back: Normal range of motion and neck supple.  Neurological:     General: No focal deficit present.     Mental Status: She is alert and oriented to person, place, and time.     Cranial Nerves: No cranial nerve deficit.  Skin:    General: Skin is warm and dry.     Findings: No erythema.  Psychiatric:        Mood and Affect: Mood normal.        Behavior: Behavior normal.        Judgment: Judgment normal.     Baseline FHR: 140 beats/min   Variability: moderate   Accelerations: present   Decelerations: absent Contractions: present frequency: irregular, 2-3 q 10 min Overall assessment: cat 1  Bedside Ultrasound:  Number of Fetus: 1  Presentation: cephalic  Fluid: normal (MVP 2.1 cm)  Placental Location: anterior, no previa  Last growth u/s on 02/26/2020: 35th %ile, AC 45th %ile (2,628 grams - 5 lb 13 oz)  Lab Results  Component Value Date   SARSCOV2NAA NEGATIVE 03/19/2020   Assessment:  Sara Mcneil is a 24 y.o. 30 female at [redacted]w[redacted]d with elective induction of labor for advanced cervical dilatino.   Plan:  1. Admit to Labor & Delivery  2. CBC,  T&S, Clrs, IVF 3. GBS negative on 02/26/20.  4. Fetwal well-being: reassuring 5. Discussed the various methods of IOL.  Will start with pitocin.  AROM when/if appropriate. She is undecided regarding epidural. Discussed that AROM might accelerate her pain level and she might have a sharp increase in pain and there might not be time to get an epidural, if she wants one. She voiced understanding.    Thomasene Mohair, MD 03/19/2020 10:53 AM

## 2020-03-20 ENCOUNTER — Inpatient Hospital Stay: Payer: Medicaid Other | Admitting: Certified Registered"

## 2020-03-20 ENCOUNTER — Encounter
Admission: EM | Disposition: A | Discharge status: Home / Self Care | Payer: Self-pay | Source: Home / Self Care | Attending: Obstetrics and Gynecology

## 2020-03-20 DIAGNOSIS — Z302 Encounter for sterilization: Secondary | ICD-10-CM

## 2020-03-20 HISTORY — PX: TUBAL LIGATION: SHX77

## 2020-03-20 LAB — CBC
HCT: 26.4 % — ABNORMAL LOW (ref 36.0–46.0)
Hemoglobin: 9.1 g/dL — ABNORMAL LOW (ref 12.0–15.0)
MCH: 30.1 pg (ref 26.0–34.0)
MCHC: 34.5 g/dL (ref 30.0–36.0)
MCV: 87.4 fL (ref 80.0–100.0)
Platelets: 181 10*3/uL (ref 150–400)
RBC: 3.02 MIL/uL — ABNORMAL LOW (ref 3.87–5.11)
RDW: 13.7 % (ref 11.5–15.5)
WBC: 11.4 10*3/uL — ABNORMAL HIGH (ref 4.0–10.5)
nRBC: 0 % (ref 0.0–0.2)

## 2020-03-20 LAB — RPR: RPR Ser Ql: NONREACTIVE

## 2020-03-20 SURGERY — LIGATION, FALLOPIAN TUBE, POSTPARTUM
Anesthesia: General | Site: Abdomen | Laterality: Bilateral

## 2020-03-20 MED ORDER — HYDROCODONE-ACETAMINOPHEN 5-325 MG PO TABS
1.0000 | ORAL_TABLET | Freq: Four times a day (QID) | ORAL | Status: DC | PRN
  Administered 2020-03-20: 1 via ORAL
  Filled 2020-03-20: qty 1

## 2020-03-20 MED ORDER — DROPERIDOL 2.5 MG/ML IJ SOLN
0.6250 mg | Freq: Once | INTRAMUSCULAR | Status: DC | PRN
  Filled 2020-03-20: qty 2

## 2020-03-20 MED ORDER — SUCCINYLCHOLINE CHLORIDE 20 MG/ML IJ SOLN
INTRAMUSCULAR | Status: DC | PRN
  Administered 2020-03-20: 80 mg via INTRAVENOUS

## 2020-03-20 MED ORDER — LIDOCAINE HCL (CARDIAC) PF 100 MG/5ML IV SOSY
PREFILLED_SYRINGE | INTRAVENOUS | Status: DC | PRN
  Administered 2020-03-20: 60 mg via INTRAVENOUS

## 2020-03-20 MED ORDER — LACTATED RINGERS IV SOLN: INTRAVENOUS | Status: DC | PRN

## 2020-03-20 MED ORDER — PROMETHAZINE HCL 25 MG/ML IJ SOLN: 6.2500 mg | INTRAMUSCULAR | Status: DC | PRN

## 2020-03-20 MED ORDER — BUPIVACAINE HCL 0.5 % IJ SOLN
INTRAMUSCULAR | Status: DC | PRN
  Administered 2020-03-20: 2 mL

## 2020-03-20 MED ORDER — ONDANSETRON HCL 4 MG/2ML IJ SOLN
INTRAMUSCULAR | Status: DC | PRN
  Administered 2020-03-20: 4 mg via INTRAVENOUS

## 2020-03-20 MED ORDER — PHENYLEPHRINE HCL (PRESSORS) 10 MG/ML IV SOLN
INTRAVENOUS | Status: AC
  Filled 2020-03-20: qty 1

## 2020-03-20 MED ORDER — OXYCODONE-ACETAMINOPHEN 5-325 MG PO TABS
2.0000 | ORAL_TABLET | ORAL | Status: DC | PRN
  Administered 2020-03-20: 2 via ORAL
  Filled 2020-03-20: qty 2

## 2020-03-20 MED ORDER — KETOROLAC TROMETHAMINE 30 MG/ML IJ SOLN
INTRAMUSCULAR | Status: DC | PRN
  Administered 2020-03-20: 30 mg via INTRAVENOUS

## 2020-03-20 MED ORDER — MEPERIDINE HCL 50 MG/ML IJ SOLN: 6.2500 mg | INTRAMUSCULAR | Status: DC | PRN

## 2020-03-20 MED ORDER — FENTANYL CITRATE (PF) 100 MCG/2ML IJ SOLN
25.0000 ug | INTRAMUSCULAR | Status: DC | PRN
  Administered 2020-03-20: 25 ug via INTRAVENOUS
  Administered 2020-03-20: 50 ug via INTRAVENOUS
  Administered 2020-03-20: 25 ug via INTRAVENOUS

## 2020-03-20 MED ORDER — PHENYLEPHRINE HCL (PRESSORS) 10 MG/ML IV SOLN
INTRAVENOUS | Status: DC | PRN
  Administered 2020-03-20: 50 ug via INTRAVENOUS

## 2020-03-20 MED ORDER — EPHEDRINE SULFATE 50 MG/ML IJ SOLN: INTRAMUSCULAR | Status: DC | PRN

## 2020-03-20 MED ORDER — OXYCODONE-ACETAMINOPHEN 5-325 MG PO TABS
1.0000 | ORAL_TABLET | ORAL | Status: DC | PRN
  Administered 2020-03-20: 1 via ORAL
  Filled 2020-03-20: qty 1

## 2020-03-20 MED ORDER — FENTANYL CITRATE (PF) 100 MCG/2ML IJ SOLN
INTRAMUSCULAR | Status: DC | PRN
  Administered 2020-03-20: 100 ug via INTRAVENOUS

## 2020-03-20 MED ORDER — PROPOFOL 10 MG/ML IV BOLUS
INTRAVENOUS | Status: AC
  Filled 2020-03-20: qty 20

## 2020-03-20 MED ORDER — DEXAMETHASONE SODIUM PHOSPHATE 10 MG/ML IJ SOLN
INTRAMUSCULAR | Status: DC | PRN
  Administered 2020-03-20: 5 mg via INTRAVENOUS

## 2020-03-20 MED ORDER — PROPOFOL 10 MG/ML IV BOLUS
INTRAVENOUS | Status: DC | PRN
  Administered 2020-03-20: 150 mg via INTRAVENOUS

## 2020-03-20 MED ORDER — FENTANYL CITRATE (PF) 100 MCG/2ML IJ SOLN
INTRAMUSCULAR | Status: AC
  Filled 2020-03-20: qty 2

## 2020-03-20 MED ORDER — MENTHOL 3 MG MT LOZG
1.0000 | LOZENGE | OROMUCOSAL | Status: DC | PRN
  Filled 2020-03-20: qty 9

## 2020-03-20 MED ORDER — LACTATED RINGERS IV SOLN: INTRAVENOUS | Status: DC

## 2020-03-20 SURGICAL SUPPLY — 33 items
BLADE SURG SZ11 CARB STEEL (BLADE) ×3 IMPLANT
CHLORAPREP W/TINT 26 (MISCELLANEOUS) ×3 IMPLANT
COVER WAND RF STERILE (DRAPES) ×3 IMPLANT
DERMABOND ADVANCED (GAUZE/BANDAGES/DRESSINGS) ×2
DERMABOND ADVANCED .7 DNX12 (GAUZE/BANDAGES/DRESSINGS) ×1 IMPLANT
DRAPE LAPAROTOMY 77X122 PED (DRAPES) ×3 IMPLANT
ELECT CAUTERY BLADE 6.4 (BLADE) ×3 IMPLANT
ELECT REM PT RETURN 9FT ADLT (ELECTROSURGICAL) ×3
ELECTRODE REM PT RTRN 9FT ADLT (ELECTROSURGICAL) ×1 IMPLANT
GLOVE BIO SURGEON STRL SZ7 (GLOVE) ×3 IMPLANT
GLOVE BIOGEL PI IND STRL 7.5 (GLOVE) ×1 IMPLANT
GLOVE BIOGEL PI INDICATOR 7.5 (GLOVE) ×2
GOWN STRL REUS W/ TWL LRG LVL3 (GOWN DISPOSABLE) ×2 IMPLANT
GOWN STRL REUS W/ TWL XL LVL3 (GOWN DISPOSABLE) IMPLANT
GOWN STRL REUS W/TWL LRG LVL3 (GOWN DISPOSABLE) ×4
GOWN STRL REUS W/TWL XL LVL3 (GOWN DISPOSABLE)
KIT TURNOVER CYSTO (KITS) ×3 IMPLANT
LABEL OR SOLS (LABEL) ×3 IMPLANT
NEEDLE HYPO 22GX1.5 SAFETY (NEEDLE) ×3 IMPLANT
NS IRRIG 500ML POUR BTL (IV SOLUTION) ×3 IMPLANT
PACK BASIN MINOR (MISCELLANEOUS) ×3 IMPLANT
RETRACTOR RING XSMALL (MISCELLANEOUS) IMPLANT
RETRACTOR WOUND ALXS 18CM SML (MISCELLANEOUS) IMPLANT
RTRCTR WOUND ALEXIS 13CM XS SH (MISCELLANEOUS)
RTRCTR WOUND ALEXIS O 18CM SML (MISCELLANEOUS)
SUT MNCRL 4-0 (SUTURE) ×2
SUT MNCRL 4-0 27XMFL (SUTURE) ×1
SUT PLAIN GUT 0 (SUTURE) ×6 IMPLANT
SUT VIC AB 2-0 UR6 27 (SUTURE) ×3 IMPLANT
SUT VIC AB 3-0 SH 27 (SUTURE) ×2
SUT VIC AB 3-0 SH 27X BRD (SUTURE) ×1 IMPLANT
SUTURE MNCRL 4-0 27XMF (SUTURE) ×1 IMPLANT
SYR 10ML LL (SYRINGE) ×3 IMPLANT

## 2020-03-20 NOTE — Social Work (Addendum)
TOC CM/SW consult in progress.  Patient feeling out of it. Stated that she "just go out of surgery."  Patient reported that she is not discharging today. Noted that she is discharging in the AM.   Social is going to check in with pt 03/21/2020 AM hour.   Resources:   [employer] [facility] Needs:  [facility] [address] [place], [place] [address] Phone: [phone] Fax: [phone] Crisis Line: [phone]  Primary Care Provider:  [facility] in [place] [address] [place], [place], [place] [address] [phone]  [facility]

## 2020-03-20 NOTE — Anesthesia Postprocedure Evaluation (Signed)
Anesthesia Post Note  Patient: Sara Mcneil  Procedure(s) Performed: POST PARTUM TUBAL LIGATION (Bilateral Abdomen)  Patient location during evaluation: PACU Anesthesia Type: General Level of consciousness: awake and alert Pain management: pain level controlled Vital Signs Assessment: post-procedure vital signs reviewed and stable Respiratory status: spontaneous breathing, nonlabored ventilation and respiratory function stable Cardiovascular status: blood pressure returned to baseline and stable Postop Assessment: no apparent nausea or vomiting Anesthetic complications: no   No complications documented.   Last Vitals:  Vitals:   03/20/20 1310 03/20/20 1413  BP: 116/65 120/85  Pulse: 74 (!) 56  Resp: 18 18  Temp: 36.9 C 36.7 C  SpO2: 98% 99%    Last Pain:  Vitals:   03/20/20 1420  TempSrc:   PainSc: 2                  Christia Reading

## 2020-03-20 NOTE — Progress Notes (Signed)
Patient returned to room from PACU, report recived from PACU RN.

## 2020-03-20 NOTE — Anesthesia Procedure Notes (Signed)
Procedure Name: Intubation Date/Time: 03/20/2020 11:20 AM Performed by: Clyde Lundborg, CRNA Pre-anesthesia Checklist: Patient identified, Emergency Drugs available, Suction available and Patient being monitored Patient Re-evaluated:Patient Re-evaluated prior to induction Oxygen Delivery Method: Circle system utilized Preoxygenation: Pre-oxygenation with 100% oxygen Induction Type: IV induction Laryngoscope Size: McGraph and 3 Grade View: Grade I Tube type: Oral Tube size: 6.5 mm Number of attempts: 1 Airway Equipment and Method: Video-laryngoscopy Placement Confirmation: ETT inserted through vocal cords under direct vision,  positive ETCO2,  breath sounds checked- equal and bilateral and CO2 detector Secured at: 21 cm Tube secured with: Tape Dental Injury: Teeth and Oropharynx as per pre-operative assessment

## 2020-03-20 NOTE — Anesthesia Preprocedure Evaluation (Signed)
Anesthesia Evaluation    Reviewed: Allergy & Precautions, Patient's Chart, lab work & pertinent test results  Airway Mallampati: I  TM Distance: >3 FB Neck ROM: full    Dental  (+) Teeth Intact   Pulmonary neg pulmonary ROS,    Pulmonary exam normal        Cardiovascular negative cardio ROS Normal cardiovascular exam     Neuro/Psych PSYCHIATRIC DISORDERS Anxiety negative neurological ROS     GI/Hepatic Neg liver ROS, GERD  Medicated and Controlled,  Endo/Other  negative endocrine ROS  Renal/GU negative Renal ROS     Musculoskeletal negative musculoskeletal ROS (+)   Abdominal   Peds  Hematology  (+) Blood dyscrasia, anemia ,   Anesthesia Other Findings   Reproductive/Obstetrics                             Anesthesia Physical Anesthesia Plan  ASA: II  Anesthesia Plan: General   Post-op Pain Management:    Induction: Intravenous  PONV Risk Score and Plan: 3 and Ondansetron, Treatment may vary due to age or medical condition, Diphenhydramine and Propofol infusion  Airway Management Planned: Oral ETT  Additional Equipment:   Intra-op Plan:   Post-operative Plan: Extubation in OR  Informed Consent: I have reviewed the patients History and Physical, chart, labs and discussed the procedure including the risks, benefits and alternatives for the proposed anesthesia with the patient or authorized representative who has indicated his/her understanding and acceptance.       Plan Discussed with:   Anesthesia Plan Comments:         Anesthesia Quick Evaluation

## 2020-03-20 NOTE — Op Note (Signed)
     Op Note Postpartum Tubal Ligation  Pre-Op Diagnosis: multiparity, desires permanent sterilization  Post-Op Diagnosis: multiparity, desires permanent sterilization  Procedures:  Postpartum tubal ligation via Pomeroy method  Primary Surgeon: Thomasene Mohair, MD  EBL: 10 ml   IVF: 700 mL   Specimens: portion of right and left fallopian tubes  Drains: None  Complications: None   Disposition: PACU   Condition: Stable   Findings: normal appearing bilateral fallopian tubes  Indication: The patient is a 24 y.o. [id number] who is postpartum day 1 status post spontaneous vaginal delivery.  She has been counseled extensively regarding risks, benefits, and alternatives to tubal ligation, including non-permanent forms of contraception that are equivalent in efficacy with potentially better side effects.  She has been advised that there is a failure rate of 3-5 in every 1,000 tubal ligations per year with an increased risk of ectopic pregnancy should pregnancy occur.   Procedure Summary:  The patient was taken to the operating room where general anesthesia was administered and found to be adequate. After timeout was called a small transverse, infraumbilical incision was made with the scalpel. The incision was carried down through the fascia until the peritoneum was identified and entered. The peritoneum was noted to be free of any adhesions and the incision was then extended.  The patient's left fallopian tube was identified, brought to the incision, and grasped with a Babcock clamp. The tube was then followed out to the fimbria. The Babcock clamp was then used to grasp the tube approximately 4 cm from the cornual region. A 3 cm segment of tube was then ligated with the 2 free ties of plain gut, and excised. Good hemostasis was noted and the tube was returned to the abdomen. The right fallopian tube was then ligated, and a 3 cm segment excised in a similar fashion. Excellent hemostasis was noted,  and the tube returned to the abdomen.  The peritoneum and fascia were closed in a single layer using 3-0 Vicryl. The subcutaneous tissue was re-approximated using 3-0 Vicryl in a running fashion to decrease tension on the skin closure.  The skin was closed in a subcuticular fashion using 4-0 monocryl. The closure was also closed with Dermabond.  Sponge, lap, needle, and instrument counts were correct x 2.  VTE prophylaxis: SCDs. Antibiotic prophylaxis: none indicated. The patient tolerated the procedure well and was taken to the PACU in stable condition.   Thomasene Mohair, MD 03/20/2020 12:55 PM

## 2020-03-20 NOTE — Progress Notes (Signed)
Obstetric Postpartum Daily Progress Note  Chief Complaint: Postpartum care after vaginal delivery  Subjective:  24 y.o. [id number] postpartum day #1 status post vaginal delivery.  She is ambulating, is tolerating po (though NPO for surgery), is voiding spontaneously.  Her pain is well controlled on PO pain medications. Her lochia is less than menses.    Medications SCHEDULED MEDICATIONS  . ferrous sulfate  325 mg Oral BID WC  . ibuprofen  600 mg Oral Q6H  . prenatal multivitamin  1 tablet Oral Q1200  . senna-docusate  2 tablet Oral Q24H    MEDICATION INFUSIONS  . lactated ringers 125 mL/hr at 03/20/20 0353    PRN MEDICATIONS  acetaminophen, benzocaine-Menthol, coconut oil, witch hazel-glycerin **AND** dibucaine, diphenhydrAMINE, ondansetron **OR** ondansetron (ZOFRAN) IV, simethicone, varicella virus vaccine live    Objective:   Vitals:   03/19/20 2218 03/19/20 2324 03/20/20 0349 03/20/20 0728  BP: 106/65 104/62 108/75 107/68  Pulse: 87 76 72 73  Resp: 20 20 18 18   Temp: 98.9 F (37.2 C) 98.3 F (36.8 C) 98.5 F (36.9 C) 98.3 F (36.8 C)  TempSrc: Oral Oral Oral Oral  SpO2: 100% 99% 100% 100%  Weight:      Height:        Current Vital Signs 24h Vital Sign Ranges  T 98.3 F (36.8 C) Temp  Avg: 98.4 F (36.9 C)  Min: 98.1 F (36.7 C)  Max: 98.9 F (37.2 C)  BP 107/68 BP  Min: 100/82  Max: 132/83  HR 73 Pulse  Avg: 89.2  Min: 72  Max: 145  RR 18 Resp  Avg: 17  Min: 14  Max: 20  SaO2 100 % Room Air SpO2  Avg: 99.5 %  Min: 99 %  Max: 100 %       24 Hour I/O Current Shift I/O  Time Ins Outs 08/14 0701 - 08/15 0700 In: 420.9 [I.V.:420.9] Out: 1720 [Urine:1200] No intake/output data recorded.  General: NAD Pulmonary: no increased work of breathing Abdomen: non-distended, non-tender, fundus firm at level of umbilicus Extremities: no edema, no erythema, no tenderness  Labs:  Recent Labs  Lab 03/19/20 0849 03/20/20 0625  WBC 10.6* 11.4*  HGB 10.4* 9.1*  HCT 29.7*  26.4*  PLT 221 181     Assessment:   23 y.o. 03/22/20 postpartum day # 1 status post SVD, doing well. Desires permanent sterilization.  Plan:   1) Acute blood loss anemia - hemodynamically stable and asymptomatic - po ferrous sulfate  2) O POS /  Information for the patient's newborn:  Kaelynn, Igo Girl Yuritza Domingo Sep  O POS   / Rubella 1.09 (01/15 1552)/ Varicella Not immune - Varivax prior to discharge  3) TDAP status up to date  4) breast /Contraception = bilateral tubal ligation. To OR this AM for pp BTL. 24 y.o. 30  with undesired fertility, desires permanent sterilization.  Other reversible forms of contraception were discussed with patient; she declines all other modalities. Permanent nature of as well as associated risks of the procedure discussed with patient including but not limited to: risk of regret, permanence of method, bleeding, infection, injury to surrounding organs and need for additional procedures.  Failure risk of 0.5-1% with increased risk of ectopic gestation if pregnancy occurs was also discussed with patient.    5) Disposition: home later today or tomorrow.  [id number], MD 03/20/2020 9:38 AM

## 2020-03-20 NOTE — Progress Notes (Signed)
Pt to OR in bed via orderly. 

## 2020-03-20 NOTE — Lactation Note (Signed)
This note was copied from a baby's chart. Lactation Consultation Note  Patient Name: Sara Mcneil TGGYI'R Date: 03/20/2020 Reason for consult: Follow-up assessment;Term  Mom had BTL today.  She breast fed [name] shortly after returning to her room from surgery.  When went into mom's room this time, [name] was demonstrating feeding cues which were pointed out to mom.  Demonstrated how easily mom could hand express colostrum.  Positioned mom with pillow support in comfortable position with [name] in cradle hold skin to skin.  [name] latched with minimal assistance to the right breast and began strong rhythmic sucking with audible swallows.  She breast fed for 20 minutes.  She attempted to put her to the left breast, but she did not show interest.  Mom voices slight nipple tenderness.  Hand expressed colostrum and rubbed on nipples to prevent bacteria, lubricate and help with tenderness.  Coconut oil had been given and instructed in use.  Mom has 3 boys.  The 71 year old is her step son.  She has a 7 year old and 59 year old that she tried to breast feed without success.  She said they never latched well so never had enough milk.  She reports not having any latch issues with [name].  Hand out given on what to expect the first 4 days after birth with feeding reviewing normal newborn stomach size, supply and demand, normal course of lactation and routine newborn feeding patterns.  Lactation [employer] given and reviewed.  Lactation name and number written on white board and encouraged to call with any questions, concerns or assistance.   Maternal Data Formula Feeding for Exclusion: No Has patient been taught Hand Expression?: Yes Does the patient have breastfeeding experience prior to this delivery?: Yes  Feeding Feeding Type: Breast Fed  LATCH Score Latch: Grasps breast easily, tongue down, lips flanged, rhythmical sucking.  Audible Swallowing: A few with stimulation  Type of Nipple:  Everted at rest and after stimulation  Comfort (Breast/Nipple): Filling, red/small blisters or bruises, mild/mod discomfort  Hold (Positioning): Assistance needed to correctly position infant at breast and maintain latch.  LATCH Score: 7  Interventions Interventions: Breast feeding basics reviewed;Assisted with latch;Skin to skin;Breast massage;Hand express;Breast compression;Adjust position;Support pillows;Position options;Coconut oil  Lactation Tools Discussed/Used Tools: Coconut oil WIC Program: Yes   Consult Status Consult Status: Follow-up Follow-up type: Call as needed    Louis Meckel 03/20/2020, 6:19 PM

## 2020-03-20 NOTE — Progress Notes (Signed)
Spoke with anesthesia in regards to carbohydrate drink that is ordered for pt to have three hours prior to surgery and then npo at 430 am.Surgery not until 1051 am. Pt has been NPO since MN. CRNA Short stated ok to give carbohydrate drink at 730 am. Carbohydrate drink given and chg wipes given and scds placed on bed.

## 2020-03-20 NOTE — Transfer of Care (Signed)
Immediate Anesthesia Transfer of Care Note  Patient: Sara Mcneil  Procedure(s) Performed: POST PARTUM TUBAL LIGATION (Bilateral Abdomen)  Patient Location: PACU  Anesthesia Type:General  Level of Consciousness: awake, alert  and oriented  Airway & Oxygen Therapy: Patient Spontanous Breathing  Post-op Assessment: Report given to RN and Post -op Vital signs reviewed and stable  Post vital signs: Reviewed and stable  Last Vitals:  Vitals Value Taken Time  BP    Temp    Pulse    Resp    SpO2      Last Pain:  Vitals:   03/20/20 0815  TempSrc:   PainSc: 0-No pain      Patients Stated Pain Goal: 2 (03/19/20 2005)  Complications: No complications documented.

## 2020-03-21 ENCOUNTER — Encounter: Payer: Self-pay | Admitting: Obstetrics and Gynecology

## 2020-03-21 LAB — CBC
HCT: 29 % — ABNORMAL LOW (ref 36.0–46.0)
Hemoglobin: 9.4 g/dL — ABNORMAL LOW (ref 12.0–15.0)
MCH: 29.9 pg (ref 26.0–34.0)
MCHC: 32.4 g/dL (ref 30.0–36.0)
MCV: 92.4 fL (ref 80.0–100.0)
Platelets: 212 10*3/uL (ref 150–400)
RBC: 3.14 MIL/uL — ABNORMAL LOW (ref 3.87–5.11)
RDW: 13.9 % (ref 11.5–15.5)
WBC: 9.1 10*3/uL (ref 4.0–10.5)
nRBC: 0 % (ref 0.0–0.2)

## 2020-03-21 MED ORDER — IBUPROFEN 600 MG PO TABS: 600.0000 mg | ORAL_TABLET | Freq: Four times a day (QID) | ORAL | 0 refills | Status: DC

## 2020-03-21 MED ORDER — OXYCODONE-ACETAMINOPHEN 5-325 MG PO TABS: 1.0000 | ORAL_TABLET | Freq: Four times a day (QID) | ORAL | 0 refills | Status: AC | PRN

## 2020-03-21 MED ORDER — SERTRALINE HCL 50 MG PO TABS
ORAL_TABLET | ORAL | 2 refills | Status: DC
Start: 2020-03-21 — End: 2023-12-05

## 2020-03-21 MED ORDER — SERTRALINE HCL 25 MG PO TABS
25.0000 mg | ORAL_TABLET | Freq: Once | ORAL | Status: AC
  Administered 2020-03-21: 25 mg via ORAL
  Filled 2020-03-21: qty 1

## 2020-03-21 MED ORDER — FERROUS SULFATE 325 (65 FE) MG PO TABS: 325.0000 mg | ORAL_TABLET | Freq: Every day | ORAL | 1 refills | Status: DC

## 2020-03-21 NOTE — Lactation Note (Signed)
This note was copied from a baby's chart. Lactation Consultation Note  Patient Name: Sara Mcneil BZJIR'C Date: 03/21/2020 Reason for consult: Follow-up assessment;Term;Other (Comment) [name] is cluster feeding)  Observed mom latching [name] to left breast in modified sidelying position with good pillow support.  She has strong, rhythmic suck with swallows heard which were pointed out to mom.  [name] has been cluster feeding all night.  Mom is fine to consistently put her to the breast whenever she demonstrates feeding cues with the assurance that once the mature milk comes in she will get full quicker and stay full longer.  Mom is so glad that this baby is latching and nursing well and that she can hand express so much colostrum, because she did not have any success with breast feeding her other 2.  Mom reports that her nipples are more tender than yesterday but is tolerable. [name] is getting a deep latch with wide flanged lips and has no anatomical issues.  Mom's nipples are red but no breakage of skin is noted.  Mom already has coconut oil.  Comfort gels given with instructions in alternating use.  Encouraged mom to call with any questions, concerns or assistance.     Maternal Data Formula Feeding for Exclusion: No Has patient been taught Hand Expression?: Yes Does the patient have breastfeeding experience prior to this delivery?: Yes  Feeding Feeding Type: Breast Fed  LATCH Score Latch: Grasps breast easily, tongue down, lips flanged, rhythmical sucking.  Audible Swallowing: Spontaneous and intermittent  Type of Nipple: Everted at rest and after stimulation  Comfort (Breast/Nipple): Filling, red/small blisters or bruises, mild/mod discomfort  Hold (Positioning): No assistance needed to correctly position infant at breast.  LATCH Score: 9  Interventions Interventions: Hand express;Breast compression;Support pillows;Position options;Coconut oil;Comfort gels  Lactation Tools  Discussed/Used Tools: Coconut oil;Comfort gels WIC Program: Yes   Consult Status Consult Status: PRN Follow-up type: Call as needed    Louis Meckel 03/21/2020, 1:07 PM

## 2020-03-21 NOTE — Progress Notes (Addendum)
Patient discharged home with infant and spouse. Discharge instructions and prescriptions given and reviewed with patient. Patient wants to make 2 week follow up herself. First dose of Zoloft given prior to discharge (EPDS 12). Varicella vaccine offered, would like to discharge ASAP will get outpatient. Patient verbalized understanding. Escorted out by auxillary.

## 2020-03-21 NOTE — Progress Notes (Signed)
Post Partum Day 2/ POD #1 s/p postpartum BTL Subjective: up ad lib, voiding and tolerating PO. Some mild abdominal pain from surgery. Breast feeding going well. 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 Was not treated for PPD after first pregnancy, but did get treatment after her second pregnancy. Also has a history of anxiety. Has been on Wellbutrin, Effexor and Zoloft in the past. EPDS score today was 12 Objective: Blood pressure (!) 103/59, pulse 60, temperature 98 F (36.7 C), resp. rate 18, height 5\' 8"  (1.727 m), weight 70.8 kg, last menstrual period 06/20/2019, SpO2 99 %, unknown if currently breastfeeding.  Physical Exam:  General: alert, cooperative and no distress Lochia: appropriate Uterine Fundus: firm Umbilical Incision: C&D&I DVT Evaluation: No evidence of DVT seen on physical exam.  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

## 2020-03-21 NOTE — Discharge Instructions (Signed)
Postpartum Tubal Ligation, Care After This sheet gives you information about how to care for yourself after your procedure. Your health care provider may also give you more specific instructions. If you have problems or questions, contact your health care provider. What can I expect after the procedure? After the procedure, you may have:  A sore throat.  Bruising or pain in your back.  Nausea or vomiting.  Dizziness.  Mild abdominal discomfort or pain, such as cramping, gas pain, or feeling bloated.  Soreness around the incision area.  Tiredness.  Pain in your shoulders. Follow these instructions at home: Medicines  Ask your health care provider if the medicine prescribed to you: ? Requires you to avoid driving or using heavy machinery. ? Can cause constipation. You may need to take actions to prevent or treat constipation, such as:  Drink enough fluid to keep your urine pale yellow.  Take over-the-counter or prescription medicines.  Eat foods that are high in fiber, such as beans, whole grains, and fresh fruits and vegetables.  Limit foods that are high in fat and processed sugars, such as fried or sweet foods.  Do not take aspirin because it can cause bleeding. Activity  Rest for the remainder of the day.  Return to your normal activities as told by your health care provider. Ask your health care provider what activities are safe for you.  Do not have sex, douche, or put a tampon or anything else in your vagina for 6 weeks or as long as told by your health care provider.  Do not lift anything that is heavier than your baby for 2 weeks, or the limit that you are told, until your health care provider says that it is safe. Incision care      Follow instructions from your health care provider about how to take care of your incision. Make sure you: ? Wash your hands with soap and water before and after you change your bandage (dressing). If soap and water are not  available, use hand sanitizer. ? Change your dressing as told by your health care provider. ? Leave stitches (sutures), skin glue, or adhesive strips in place. These skin closures may need to stay in place for 2 weeks or longer. If adhesive strip edges start to loosen and curl up, you may trim the loose edges. Do not remove adhesive strips completely unless your health care provider tells you to do that.  Check your incision area every day for signs of infection. Check for: ? Redness, swelling, or pain. ? Fluid or blood. ? Warmth. ? Pus or a bad smell. Other Instructions  Do not take baths, swim, or use a hot tub until your health care provider approves. Ask your health care provider if you may take showers. You may only be allowed to take sponge baths.  Keep all follow-up visits as told by your health care provider. This is important. Contact a health care provider if:  You have redness, swelling, or pain around your incision.  Your incision feels warm to the touch.  Your pain does not improve after 2-3 days.  You have a rash.  You repeatedly become dizzy or lightheaded.  Your pain medicine is not helping.  You are constipated. Get help right away if you:  Have a fever.  Faint.  Have pain in your abdomen that gets worse.  Have fluid or blood coming from your incision.  You have pus or a bad smell coming from your incision.  The  edges of your incision break open after the sutures have been removed.  Have shortness of breath or trouble breathing.  Have chest pain or leg pain.  Have ongoing nausea or diarrhea. Summary  Mild abdominal discomfort is common after this procedure.  Contact your health care provider if you experience problems or have concerns.  Do not lift anything that is heavier than your baby for 2 weeks, or the limit that you are told, until your health care provider says that it is safe.  Keep all follow-up visits as told by your health care  provider. This is important. This information is not intended to replace advice given to you by your health care provider. Make sure you discuss any questions you have with your health care provider. Document Revised: 01/05/2019 Document Reviewed: 06/12/2018 Elsevier Patient Education  2020 [employer]. Discharge Instructions:   Follow-up Appointment:  If there are any new medications, they have been ordered and will be available for pickup at the listed pharmacy on your way home from the hospital.   Call office if you have any of the following: headache, visual changes, fever >101.0 F, chills, shortness of breath, breast concerns, excessive vaginal bleeding, incision drainage or problems, leg pain or redness, depression or any other concerns. If you have vaginal discharge with an odor, let your doctor know.   It is normal to bleed for up to 6 weeks. You should not soak through more than 1 pad in 1 hour. If you have a blood clot larger than your fist with continued bleeding, call your doctor.   Activity: Do not lift > 10 lbs for 6 weeks (do not lift anything heavier than your baby). No intercourse, tampons, swimming pools, hot tubs, baths (only showers) for 6 weeks.  No driving for 1-2 weeks. Continue prenatal vitamin, especially if breastfeeding. Increase calories and fluids (water) while breastfeeding.   Your milk will come in, in the next couple of days (right now it is colostrum). You may have a slight fever when your milk comes in, but it should go away on its own.  If it does not, and rises above 101 F please call the doctor. You will also feel achy and your breasts will be firm. They will also start to leak. If you are breastfeeding, continue as you have been and you can pump/express milk for comfort.   If you have too much milk, your breasts can become engorged, which could lead to mastitis. This is an infection of the milk ducts. It can be very painful and you will need to notify  your doctor to obtain a prescription for antibiotics. You can also treat it with a shower or hot/cold compress.   For concerns about your baby, please call your pediatrician.  For breastfeeding concerns, the lactation consultant can be reached at [phone].   Postpartum blues (feelings of happy one minute and sad another minute) are normal for the first few weeks but if it gets worse let your doctor know.   Congratulations! We enjoyed caring for you and your new bundle of joy!

## 2020-03-22 LAB — SURGICAL PATHOLOGY

## 2020-04-08 ENCOUNTER — Other Ambulatory Visit: Payer: Self-pay

## 2020-04-08 ENCOUNTER — Ambulatory Visit: Payer: Medicaid Other | Admitting: Obstetrics and Gynecology

## 2020-04-08 ENCOUNTER — Encounter: Payer: Self-pay | Admitting: Obstetrics and Gynecology

## 2020-04-08 ENCOUNTER — Ambulatory Visit (INDEPENDENT_AMBULATORY_CARE_PROVIDER_SITE_OTHER): Payer: Medicaid Other | Admitting: Obstetrics and Gynecology

## 2020-04-08 VITALS — BP 118/74 | Ht 68.0 in | Wt 135.0 lb

## 2020-04-08 DIAGNOSIS — F419 Anxiety disorder, unspecified: Secondary | ICD-10-CM

## 2020-04-08 MED ORDER — HYDROXYZINE HCL 25 MG PO TABS: ORAL_TABLET | ORAL | 0 refills | Status: DC

## 2020-04-08 NOTE — Progress Notes (Signed)
Obstetrics & Gynecology Office Visit   Chief Complaint  Patient presents with   Postpartum Care    History of Present Illness: 24 y.o. [phone] female who is about 2.5 weeks postpartum from a vaginal delivery at term followed by a tubal ligation. She presents for a postpartum mood check.  Her discharge EPDS was 13. She has been mostly anxious. She feels like no one else can do anything for the baby or else it isn't done correctly. She is mostly formula feeding and is about the stop breast feeding.  She has a low appetite and doesn't sleep well.  She denies SI/HI, but states that when she is especially anxious she scratches herself and bites her lip. So, she scored the "self harm" question as a one.    She denies issues with her incision.   Past Medical History:  Diagnosis Date   HPV (human papilloma virus) infection    PID (acute pelvic inflammatory disease)    Postpartum care following vaginal delivery 02/18/2018   Preterm delivery     Past Surgical History:  Procedure Laterality Date   NO PAST SURGERIES     TUBAL LIGATION Bilateral 03/20/2020   Procedure: POST PARTUM TUBAL LIGATION;  Surgeon: Conard Novak, MD;  Location: [facility] ORS;  Service: Gynecology;  Laterality: Bilateral;    Gynecologic History: No LMP recorded.  Obstetric History: [id number]  Family History  Problem Relation Age of Onset   Breast cancer Neg Hx    Ovarian cancer Neg Hx     Social History   Socioeconomic History   Marital status: Significant Other    Spouse name: Not on file   Number of children: Not on file   Years of education: Not on file   Highest education level: Not on file  Occupational History   Not on file  Tobacco Use   Smoking status: Never Smoker   Smokeless tobacco: Never Used  Vaping Use   Vaping Use: Never used  Substance and Sexual Activity   Alcohol use: Not Currently   Drug use: Not Currently   Sexual activity: Yes    Birth control/protection:  Surgical    Comment: BTL  Other Topics Concern   Not on file  Social History Narrative   Not on file   Social Determinants of Health   Financial Resource Strain:    Difficulty of Paying Living Expenses: Not on file  Food Insecurity:    Worried About Running Out of Food in the Last Year: Not on file   Ran Out of Food in the Last Year: Not on file  Transportation Needs:    Lack of Transportation (Medical): Not on file   Lack of Transportation (Non-Medical): Not on file  Physical Activity:    Days of Exercise per Week: Not on file   Minutes of Exercise per Session: Not on file  Stress:    Feeling of Stress : Not on file  Social Connections:    Frequency of Communication with Friends and Family: Not on file   Frequency of Social Gatherings with Friends and Family: Not on file   Attends Religious Services: Not on file   Active Member of Clubs or Organizations: Not on file   Attends Banker Meetings: Not on file   Marital Status: Not on file  Intimate Partner Violence:    Fear of Current or Ex-Partner: Not on file   Emotionally Abused: Not on file   Physically Abused: Not on file   Sexually Abused:  Not on file    Allergies  Allergen Reactions   Sulfa Antibiotics Hives   Iodine    Other Hives    Itching and small hives s/p iodine injection    Prior to Admission medications   Medication Sig Start Date End Date Taking? Authorizing Provider  sertraline (ZOLOFT) 50 MG tablet Take 0.5 tablet x 3 days then increase to 50 mgm daily. 03/21/20  Yes Farrel Conners, CNM  calcium carbonate (TUMS - DOSED IN MG ELEMENTAL CALCIUM) 500 MG chewable tablet Chew 1 tablet by mouth daily. Patient not taking: Reported on 04/08/2020    [provider]  ferrous sulfate 325 (65 FE) MG tablet Take 1 tablet (325 mg total) by mouth daily with breakfast. Patient not taking: Reported on 04/08/2020 03/21/20   Farrel Conners, CNM  ibuprofen (ADVIL) 600 MG  tablet Take 1 tablet (600 mg total) by mouth every 6 (six) hours. Patient not taking: Reported on 04/08/2020 03/21/20   Farrel Conners, CNM  Prenatal MV & Min w/FA-DHA (PRENATAL ADULT GUMMY/DHA/FA) 0.4-25 MG CHEW Chew 2 tablets by mouth daily. Patient not taking: Reported on 04/08/2020    [provider]    Review of Systems  Constitutional: Negative.   HENT: Negative.   Eyes: Negative.   Respiratory: Negative.   Cardiovascular: Negative.   Gastrointestinal: Negative.   Genitourinary: Negative.   Musculoskeletal: Negative.   Skin: Negative.   Neurological: Negative.   Psychiatric/Behavioral: Positive for depression. Negative for hallucinations, memory loss, substance abuse and suicidal ideas. The patient is nervous/anxious and has insomnia.      Physical Exam BP 118/74    Ht 5\' 8"  (1.727 m)    Wt 135 lb (61.2 kg)    BMI 20.53 kg/m  No LMP recorded. Physical Exam Constitutional:      General: She is not in acute distress.    Appearance: Normal appearance.  HENT:     Head: Normocephalic and atraumatic.  Eyes:     General: No scleral icterus.    Conjunctiva/sclera: Conjunctivae normal.  Abdominal:     General: There is no distension.     Palpations: Abdomen is soft.     Tenderness: There is no abdominal tenderness.     Comments: without erythema, induration, warmth, and tenderness. It is clean, dry, and intact.    Neurological:     General: No focal deficit present.     Mental Status: She is alert and oriented to person, place, and time.     Cranial Nerves: No cranial nerve deficit.  Psychiatric:        Mood and Affect: Mood normal.        Behavior: Behavior normal.        Judgment: Judgment normal.    Female chaperone present for pelvic and breast  portions of the physical exam  EPDS: 17  Assessment: 24 y.o. 25 female here for  1. Anxiety      Plan: Problem List Items Addressed This Visit      Other   Anxiety - Primary   Relevant Medications    hydrOXYzine (ATARAX/VISTARIL) 25 MG tablet     Add Atarax for anxiety since she plans to discontinue breastfeeding. Monitor closely with high level of anxiety. She will call if not tolerating atarax well or if not helping.  Follow up in 1.5 weeks.  [id number], MD 04/08/2020 2:40 PM

## 2020-04-18 ENCOUNTER — Telehealth: Payer: Self-pay

## 2020-04-18 NOTE — Telephone Encounter (Signed)
Pt calling; has questions about incision; had tubal 8/15; one of the incisions has been sore and tender the last few days.  [phone]

## 2020-04-18 NOTE — Telephone Encounter (Signed)
Pt can hold out 'til appt on the 17th.  However, her kids have tested positive for covid.  Adv may do a virtual visit.  Pt states she have just been doing too much.

## 2020-04-19 NOTE — Telephone Encounter (Signed)
Change appointment note to telephone visit due to covid exposure

## 2020-04-22 ENCOUNTER — Encounter: Payer: Self-pay | Admitting: Obstetrics and Gynecology

## 2020-04-22 ENCOUNTER — Other Ambulatory Visit: Payer: Self-pay

## 2020-04-22 ENCOUNTER — Ambulatory Visit (INDEPENDENT_AMBULATORY_CARE_PROVIDER_SITE_OTHER): Payer: Medicaid Other | Admitting: Obstetrics and Gynecology

## 2020-04-22 DIAGNOSIS — F419 Anxiety disorder, unspecified: Secondary | ICD-10-CM

## 2020-04-22 NOTE — Progress Notes (Signed)
   Routine Prenatal Care Visit- Virtual Visit  Subjective   Virtual Visit via Telephone Note  I connected with SAHAR RYBACK on 04/22/20 at  9:00 AM EDT by telephone and verified that I am speaking with the correct person using two identifiers.   I discussed the limitations, risks, security and privacy concerns of performing an evaluation and management service by telephone and the availability of in person appointments. I also discussed with the patient that there may be a patient responsible charge related to this service. The patient expressed understanding and agreed to proceed.  The patient was at home I spoke with the patient from my  office The names of people involved in this encounter were: Jessabelle Rollen Sox  and Thomasene Mohair, MD .   Subjective: She state that she is doing better. Her child has been tested positive for COVID.  She believes she is doing a little better.  Atarax calms her down.  However, she is unable to take the medication before bed because it makes her drowsy and it's hard for her to wake up.  Her EPDS today is 20 (was 13 last time).  No SI/HI.   Objective  Physical Exam could not be performed. Because of the COVID-19 outbreak this visit was performed over the phone and not in person.   Assessment   24 y.o. [phone] female who is 4 weeks postpartum with postpartum anxiety.  Doing better overall  Plan  Continue current plan of care.     Follow Up Instructions: 2 weeks for routine 6 week postpartum visit   I discussed the assessment and treatment plan with the patient. The patient was provided an opportunity to ask questions and all were answered. The patient agreed with the plan and demonstrated an understanding of the instructions.   The patient was advised to call back or seek an in-person evaluation if the symptoms worsen or if the condition fails to improve as anticipated.  I provided 11 minutes of non-face-to-face time during this  encounter.  Return in about 2 weeks (around 05/06/2020) for Six Week Postpartum.  Thomasene Mohair, MD  [facility], [facility] 04/22/2020 9:56 AM

## 2020-05-02 NOTE — Telephone Encounter (Signed)
Patient needs appt. Sounds like she's clear from a Covid standpoint.

## 2020-05-02 NOTE — Telephone Encounter (Signed)
Patient is scheduled for 05/05/20 in [facility]

## 2020-05-05 ENCOUNTER — Encounter: Payer: Self-pay | Admitting: Obstetrics and Gynecology

## 2020-05-05 ENCOUNTER — Other Ambulatory Visit (HOSPITAL_COMMUNITY)
Admission: RE | Admit: 2020-05-05 | Discharge: 2020-05-05 | Disposition: A | Discharge status: Home / Self Care | Payer: Medicaid Other | Source: Ambulatory Visit | Attending: Obstetrics and Gynecology | Admitting: Obstetrics and Gynecology

## 2020-05-05 ENCOUNTER — Ambulatory Visit (INDEPENDENT_AMBULATORY_CARE_PROVIDER_SITE_OTHER): Payer: Medicaid Other | Admitting: Obstetrics and Gynecology

## 2020-05-05 ENCOUNTER — Other Ambulatory Visit: Payer: Self-pay

## 2020-05-05 DIAGNOSIS — F419 Anxiety disorder, unspecified: Secondary | ICD-10-CM

## 2020-05-05 DIAGNOSIS — Z124 Encounter for screening for malignant neoplasm of cervix: Secondary | ICD-10-CM

## 2020-05-05 MED ORDER — BUSPIRONE HCL 5 MG PO TABS: ORAL_TABLET | ORAL | 0 refills | Status: DC

## 2020-05-05 NOTE — Progress Notes (Signed)
Postpartum Visit   Chief Complaint  Patient presents with  . Postpartum Care    vaginal delivery 8/14   History of Present Illness: Patient is a 24 y.o. [id number] presents for postpartum visit.  Date of delivery: 03/19/2020 Type of delivery: Vaginal delivery - Vacuum or forceps assisted  no Episiotomy No.  Laceration: yes, 1st degree Pregnancy or labor problems:  no Any problems since the delivery:  No issues  Newborn Details:  SINGLETON :  1. Baby's name: [name] (female). Birth weight: 3,360 grams Maternal Details:  Breast Feeding:  no Post partum depression/anxiety noted:  yes Edinburgh Post-Partum Depression Score:  16 (feeling more overwhelmed than depressed)  Date of last PAP: 03/12/2019, ASCUS, HPV+   Past Medical History:  Diagnosis Date  . HPV (human papilloma virus) infection   . PID (acute pelvic inflammatory disease)   . Postpartum care following vaginal delivery 02/18/2018  . Preterm delivery     Past Surgical History:  Procedure Laterality Date  . NO PAST SURGERIES    . TUBAL LIGATION Bilateral 03/20/2020   Procedure: POST PARTUM TUBAL LIGATION;  Surgeon: Conard Novak, MD;  Location: [facility] ORS;  Service: Gynecology;  Laterality: Bilateral;    Prior to Admission medications   Medication Sig Start Date End Date Taking? Authorizing Provider  ferrous sulfate 325 (65 FE) MG tablet Take 1 tablet (325 mg total) by mouth daily with breakfast. 03/21/20  Yes Farrel Conners, CNM  hydrOXYzine (ATARAX/VISTARIL) 25 MG tablet Take 1-2 tabs by mouth every 6 hours, as needed for anxiety 04/08/20  Yes Conard Novak, MD  sertraline (ZOLOFT) 50 MG tablet Take 0.5 tablet x 3 days then increase to 50 mgm daily. 03/21/20  Yes Farrel Conners, CNM    Allergies  Allergen Reactions  . Sulfa Antibiotics Hives  . Iodine   . Other Hives    Itching and small hives s/p iodine injection     Social History   Socioeconomic History  . Marital status: Significant Other     Spouse name: Not on file  . Number of children: Not on file  . Years of education: Not on file  . Highest education level: Not on file  Occupational History  . Not on file  Tobacco Use  . Smoking status: Never Smoker  . Smokeless tobacco: Never Used  Vaping Use  . Vaping Use: Never used  Substance and Sexual Activity  . Alcohol use: Not Currently  . Drug use: Not Currently  . Sexual activity: Not Currently    Birth control/protection: Surgical    Comment: BTL  Other Topics Concern  . Not on file  Social History Narrative  . Not on file   Social Determinants of Health   Financial Resource Strain:   . Difficulty of Paying Living Expenses: Not on file  Food Insecurity:   . Worried About Programme researcher, broadcasting/film/video in the Last Year: Not on file  . Ran Out of Food in the Last Year: Not on file  Transportation Needs:   . Lack of Transportation (Medical): Not on file  . Lack of Transportation (Non-Medical): Not on file  Physical Activity:   . Days of Exercise per Week: Not on file  . Minutes of Exercise per Session: Not on file  Stress:   . Feeling of Stress : Not on file  Social Connections:   . Frequency of Communication with Friends and Family: Not on file  . Frequency of Social Gatherings with Friends and Family: Not on  file  . Attends Religious Services: Not on file  . Active Member of Clubs or Organizations: Not on file  . Attends Banker Meetings: Not on file  . Marital Status: Not on file  Intimate Partner Violence:   . Fear of Current or Ex-Partner: Not on file  . Emotionally Abused: Not on file  . Physically Abused: Not on file  . Sexually Abused: Not on file    Family History  Problem Relation Age of Onset  . Breast cancer Neg Hx   . Ovarian cancer Neg Hx     Review of Systems  Constitutional: Negative.   HENT: Negative.   Eyes: Negative.   Respiratory: Negative.   Cardiovascular: Negative.   Gastrointestinal: Negative.   Genitourinary:  Negative.   Musculoskeletal: Negative.   Skin: Negative.   Neurological: Negative.   Psychiatric/Behavioral: Negative.      Physical Exam BP 111/76   Wt 131 lb (59.4 kg)   LMP 05/03/2020   Breastfeeding Yes   BMI 19.92 kg/m   Physical Exam Constitutional:      General: She is not in acute distress.    Appearance: Normal appearance. She is well-developed.  Genitourinary:     Pelvic exam was performed with patient in the lithotomy position.     Vulva, inguinal canal, urethra, bladder, vagina, uterus, right adnexa and left adnexa normal.     No posterior fourchette tenderness, injury or lesion present.     No cervical friability, lesion, bleeding or polyp.  HENT:     Head: Normocephalic and atraumatic.  Eyes:     General: No scleral icterus.    Conjunctiva/sclera: Conjunctivae normal.  Cardiovascular:     Rate and Rhythm: Normal rate and regular rhythm.     Heart sounds: No murmur heard.  No friction rub. No gallop.   Pulmonary:     Effort: Pulmonary effort is normal. No respiratory distress.     Breath sounds: Normal breath sounds. No wheezing or rales.  Abdominal:     General: Bowel sounds are normal. There is no distension.     Palpations: Abdomen is soft. There is no mass.     Tenderness: There is no abdominal tenderness. There is no guarding or rebound.     Comments: without erythema, induration, warmth, and tenderness. It is clean, dry, and intact.    Musculoskeletal:        General: Normal range of motion.     Cervical back: Normal range of motion and neck supple.  Neurological:     General: No focal deficit present.     Mental Status: She is alert and oriented to person, place, and time.     Cranial Nerves: No cranial nerve deficit.  Skin:    General: Skin is warm and dry.     Findings: No erythema.  Psychiatric:        Mood and Affect: Mood normal.        Behavior: Behavior normal.        Judgment: Judgment normal.      Female Chaperone present during  breast and/or pelvic exam.  Assessment: 24 y.o. [id number] presenting for 6 week postpartum visit  Plan: Problem List Items Addressed This Visit      Other   Anxiety   Relevant Medications   busPIRone (BUSPAR) 5 MG tablet    Other Visit Diagnoses    Postpartum care and examination    -  Primary   Relevant Medications   busPIRone (BUSPAR)  5 MG tablet   Other Relevant Orders   Cytology - PAP   Pap smear for cervical cancer screening       Relevant Orders   Cytology - PAP     1) Contraception: s/p BTL  2)  Pap - ASCCP guidelines and rational discussed.  Patient opts for routine screening interval. Two consecutive abnormal pap smears. Performed today and if abnormal will get colposcopy.   3) Patient underwent screening for postpartum depression with some concerns noted for anxiety. Will start Buspar.   4) Follow up 1 year for routine annual exam  Thomasene Mohair, MD 05/05/2020 5:02 PM

## 2020-05-09 ENCOUNTER — Ambulatory Visit: Payer: Medicaid Other | Admitting: Obstetrics and Gynecology

## 2020-05-10 LAB — CYTOLOGY - PAP
Chlamydia: NEGATIVE
Comment: NEGATIVE
Comment: NEGATIVE
Comment: NORMAL
Diagnosis: NEGATIVE
Diagnosis: REACTIVE
High risk HPV: NEGATIVE
Neisseria Gonorrhea: NEGATIVE

## 2020-05-23 ENCOUNTER — Other Ambulatory Visit: Payer: Self-pay

## 2020-05-23 ENCOUNTER — Ambulatory Visit (INDEPENDENT_AMBULATORY_CARE_PROVIDER_SITE_OTHER): Payer: Medicaid Other | Admitting: Obstetrics and Gynecology

## 2020-05-23 ENCOUNTER — Encounter: Payer: Self-pay | Admitting: Obstetrics and Gynecology

## 2020-05-23 VITALS — BP 110/60 | Ht 67.0 in | Wt 131.0 lb

## 2020-05-23 DIAGNOSIS — N61 Mastitis without abscess: Secondary | ICD-10-CM | POA: Diagnosis not present

## 2020-05-23 MED ORDER — CEPHALEXIN 500 MG PO CAPS: 500.0000 mg | ORAL_CAPSULE | Freq: Two times a day (BID) | ORAL | 0 refills | Status: AC

## 2020-05-23 NOTE — Progress Notes (Signed)
Pcp, No   Chief Complaint  Patient presents with  . Breast Exam    lumps on LB (look like pimples), painful, some redness x couple of days    HPI:      [name] is a 24 y.o. [phone] whose LMP was Patient's last menstrual period was 05/03/2020., presents today for painful lesions on LT breast for a few days. Started with LT breast tenderness last wk and then "pimples" developed; hurt to touch. No d/c, no fevers. No breast masses. No meds to treat, trying to take warm showers and letting water run on them. She is PP, not BF. Had some milk production initially LT breast but none now. No nipple d/c. Having menses monthly now. S/p PPTL.   Past Medical History:  Diagnosis Date  . HPV (human papilloma virus) infection   . PID (acute pelvic inflammatory disease)   . Postpartum care following vaginal delivery 02/18/2018  . Preterm delivery     Past Surgical History:  Procedure Laterality Date  . NO PAST SURGERIES    . TUBAL LIGATION Bilateral 03/20/2020   Procedure: POST PARTUM TUBAL LIGATION;  Surgeon: Conard Novak, MD;  Location: [facility] OR[facility];  Service: Gynecology;  Laterality: Bilateral;    Family History  Problem Relation Age of Onset  . Breast cancer Neg Hx   . Ovarian cancer Neg Hx     Social History   Socioeconomic History  . Marital status: Significant Other    Spouse name: Not on file  . Number of children: Not on file  . Years of education: Not on file  . Highest education level: Not on file  Occupational History  . Not on file  Tobacco Use  . Smoking status: Never Smoker  . Smokeless tobacco: Never Used  Vaping Use  . Vaping Use: Never used  Substance and Sexual Activity  . Alcohol use: Not Currently  . Drug use: Not Currently  . Sexual activity: Yes    Birth control/protection: Surgical    Comment: BTL  Other Topics Concern  . Not on file  Social History Narrative  . Not on file   Social Determinants of Health   Financial Resource  Strain:   . Difficulty of Paying Living Expenses: Not on file  Food Insecurity:   . Worried About Programme researcher, broadcasting/film/video in the Last Year: Not on file  . Ran Out of Food in the Last Year: Not on file  Transportation Needs:   . Lack of Transportation (Medical): Not on file  . Lack of Transportation (Non-Medical): Not on file  Physical Activity:   . Days of Exercise per Week: Not on file  . Minutes of Exercise per Session: Not on file  Stress:   . Feeling of Stress : Not on file  Social Connections:   . Frequency of Communication with Friends and Family: Not on file  . Frequency of Social Gatherings with Friends and Family: Not on file  . Attends Religious Services: Not on file  . Active Member of Clubs or Organizations: Not on file  . Attends Banker Meetings: Not on file  . Marital Status: Not on file  Intimate Partner Violence:   . Fear of Current or Ex-Partner: Not on file  . Emotionally Abused: Not on file  . Physically Abused: Not on file  . Sexually Abused: Not on file    Outpatient Medications Prior to Visit  Medication Sig Dispense Refill  . busPIRone (BUSPAR)  5 MG tablet Take 1 tab PO QAM x 5 days, add 1 tab PO QHS x 5 days, then 2 tabs PO QAM and 1 tab HS x 5 days, then 2 tabs PO BID 120 tablet 0  . ferrous sulfate 325 (65 FE) MG tablet Take 1 tablet (325 mg total) by mouth daily with breakfast. 30 tablet 1  . sertraline (ZOLOFT) 50 MG tablet Take 0.5 tablet x 3 days then increase to 50 mgm daily. 30 tablet 2  . hydrOXYzine (ATARAX/VISTARIL) 25 MG tablet Take 1-2 tabs by mouth every 6 hours, as needed for anxiety 60 tablet 0   No facility-administered medications prior to visit.      ROS:  Review of Systems  Constitutional: Negative for fever.  Gastrointestinal: Negative for blood in stool, constipation, diarrhea, nausea and vomiting.  Genitourinary: Negative for dyspareunia, dysuria, flank pain, frequency, hematuria, urgency, vaginal bleeding, vaginal  discharge and vaginal pain.  Musculoskeletal: Negative for back pain.  Skin: Negative for rash.   BREAST: lesions   OBJECTIVE:   Vitals:  BP 110/60   Ht 5\' 7"  (1.702 m)   Wt 131 lb (59.4 kg)   LMP 05/03/2020   Breastfeeding No   BMI 20.52 kg/m   Physical Exam Vitals reviewed.  Pulmonary:     Effort: Pulmonary effort is normal.  Chest:     Breasts: Breasts are symmetrical.        Right: No inverted nipple, mass, nipple discharge, skin change or tenderness.        Left: Skin change and tenderness present. No inverted nipple, mass or nipple discharge.    Musculoskeletal:        General: Normal range of motion.     Cervical back: Normal range of motion.  Skin:    General: Skin is warm and dry.  Neurological:     General: No focal deficit present.     Mental Status: She is alert and oriented to person, place, and time.     Cranial Nerves: No cranial nerve deficit.  Psychiatric:        Mood and Affect: Mood normal.        Behavior: Behavior normal.        Thought Content: Thought content normal.        Judgment: Judgment normal.    Assessment/Plan: Breast infection - Plan: cephALEXin (KEFLEX) 500 MG capsule; several painful montgomery glands. Question infection. Very tender. Warm compresses, Rx keflex. F/u prn sx. If persists, will eval further with breast u/s.     Meds ordered this encounter  Medications  . cephALEXin (KEFLEX) 500 MG capsule    Sig: Take 1 capsule (500 mg total) by mouth 2 (two) times daily for 7 days.    Dispense:  14 capsule    Refill:  0    Order Specific Question:   Supervising Provider    Answer:   05/05/2020 Nadara Mustard      Return if symptoms worsen or fail to improve.  Sara Duchene B. Torri Michalski, PA-C 05/23/2020 12:09 PM

## 2020-05-23 NOTE — Telephone Encounter (Signed)
Patient is scheduled today at 10 am with [facility] for poss mastitis

## 2020-05-23 NOTE — Patient Instructions (Signed)
I value your feedback and entrusting us with your care. If you get a [facility] patient survey, I would appreciate you taking the time to let us know about your experience today. Thank you!  As of July 16, 2019, your lab results will be released to your MyChart immediately, before I even have a chance to see them. Please give me time to review them and contact you if there are any abnormalities. Thank you for your patience.  

## 2020-05-27 ENCOUNTER — Other Ambulatory Visit: Payer: Self-pay | Admitting: Obstetrics and Gynecology

## 2020-05-27 DIAGNOSIS — F419 Anxiety disorder, unspecified: Secondary | ICD-10-CM

## 2020-05-31 ENCOUNTER — Ambulatory Visit: Payer: Medicaid Other | Admitting: Obstetrics and Gynecology

## 2020-05-31 ENCOUNTER — Telehealth (INDEPENDENT_AMBULATORY_CARE_PROVIDER_SITE_OTHER): Payer: Medicaid Other | Admitting: Obstetrics and Gynecology

## 2020-05-31 ENCOUNTER — Telehealth: Payer: Self-pay | Admitting: Obstetrics and Gynecology

## 2020-05-31 ENCOUNTER — Encounter: Payer: Self-pay | Admitting: Obstetrics and Gynecology

## 2020-05-31 DIAGNOSIS — F419 Anxiety disorder, unspecified: Secondary | ICD-10-CM | POA: Diagnosis not present

## 2020-05-31 MED ORDER — BUSPIRONE HCL 5 MG PO TABS: ORAL_TABLET | ORAL | 1 refills | Status: DC

## 2020-05-31 NOTE — Telephone Encounter (Signed)
-----   Message from Conard Novak, MD sent at 05/31/2020 12:16 PM EDT ----- Regarding: Follow up appointment Please schedule a follow up appointment for about 3 weeks. Phone visit is OK.

## 2020-05-31 NOTE — Telephone Encounter (Signed)
Called and left voicemail for patient to call back to confirm scheduled follow up appointement

## 2020-05-31 NOTE — Progress Notes (Signed)
Virtual Visit via Telemedicine Note  I connected with Sara Mcneil on 05/31/20 at 11:30 AM EDT by telephone and verified that I am speaking with the correct person using two identifiers.   I discussed the limitations, risks, security and privacy concerns of performing an evaluation and management service by telephone and the availability of in person appointments. I also discussed with the patient that there may be a patient responsible charge related to this service. The patient expressed understanding and agreed to proceed.  The patient was at home I spoke with the patient from my  office The names of people involved in this encounter were:  [name] and Thomasene Mohair, MD.   History of Present Illness: 24 y.o. [phone] female who is postpartum from a vaginal delivery on 03/19/20 and underwent a BTL on 8/15. She was seen at her 6 week postpartum visit and had an EPDS of 16.  She was started on Buspar. She is also taking Zoloft.  SHe was last seen on 05/05/2020.   She states that the feeling of going to work are overpowering and this is affecting.   She is taking Zoloft 50 mg.  She starts work on 06/12/2020.    PHQ9: 10 (denies self-harm) GAD7: 16  Observations/Objective: Physical Exam could not be performed. Because of the COVID-19 outbreak this visit was performed over the phone and not in person.   Assessment and Plan:   ICD-10-CM   1. Anxiety  F41.9 busPIRone (BUSPAR) 5 MG tablet  2. Postpartum care and examination  Z39.2 busPIRone (BUSPAR) 5 MG tablet     Follow Up Instructions: Increase bupsar slowly to 40 mg total daily.   I discussed the assessment and treatment plan with the patient. The patient was provided an opportunity to ask questions and all were answered. The patient agreed with the plan and demonstrated an understanding of the instructions.   The patient was advised to call back or seek an in-person evaluation if the symptoms worsen or if the condition  fails to improve as anticipated.  I provided 21 minutes of non-face-to-face time during this encounter.  Thomasene Mohair, MD  [facility], [facility] 05/31/2020 12:19 PM

## 2020-06-01 ENCOUNTER — Ambulatory Visit: Payer: Medicaid Other | Admitting: Obstetrics and Gynecology

## 2020-06-21 ENCOUNTER — Other Ambulatory Visit: Payer: Self-pay

## 2020-06-21 ENCOUNTER — Encounter (INDEPENDENT_AMBULATORY_CARE_PROVIDER_SITE_OTHER): Payer: Medicaid Other | Admitting: Obstetrics and Gynecology

## 2020-06-21 NOTE — Progress Notes (Signed)
ERROR.  Though this patient was checked in by front desk staff, I was not able to reach her by phone.

## 2020-07-13 ENCOUNTER — Emergency Department
Admission: EM | Admit: 2020-07-13 | Discharge: 2020-07-13 | Disposition: A | Discharge status: Home / Self Care | Payer: Medicaid Other | Attending: Emergency Medicine | Admitting: Emergency Medicine

## 2020-07-13 ENCOUNTER — Ambulatory Visit: Payer: Self-pay

## 2020-07-13 ENCOUNTER — Emergency Department: Payer: Medicaid Other

## 2020-07-13 ENCOUNTER — Other Ambulatory Visit: Payer: Self-pay

## 2020-07-13 DIAGNOSIS — R5383 Other fatigue: Secondary | ICD-10-CM | POA: Diagnosis not present

## 2020-07-13 DIAGNOSIS — R059 Cough, unspecified: Secondary | ICD-10-CM | POA: Insufficient documentation

## 2020-07-13 DIAGNOSIS — Z5321 Procedure and treatment not carried out due to patient leaving prior to being seen by health care provider: Secondary | ICD-10-CM | POA: Insufficient documentation

## 2020-07-13 DIAGNOSIS — U071 COVID-19: Secondary | ICD-10-CM | POA: Diagnosis not present

## 2020-07-13 DIAGNOSIS — R079 Chest pain, unspecified: Secondary | ICD-10-CM | POA: Insufficient documentation

## 2020-07-13 DIAGNOSIS — R1084 Generalized abdominal pain: Secondary | ICD-10-CM | POA: Diagnosis present

## 2020-07-13 LAB — TROPONIN I (HIGH SENSITIVITY)
Troponin I (High Sensitivity): 4 ng/L (ref <18)
Troponin I (High Sensitivity): 4 ng/L (ref <18)

## 2020-07-13 LAB — CBC
HCT: 41.2 % (ref 36.0–46.0)
Hemoglobin: 14 g/dL (ref 12.0–15.0)
MCH: 30.4 pg (ref 26.0–34.0)
MCHC: 34 g/dL (ref 30.0–36.0)
MCV: 89.4 fL (ref 80.0–100.0)
Platelets: 401 10*3/uL — ABNORMAL HIGH (ref 150–400)
RBC: 4.61 MIL/uL (ref 3.87–5.11)
RDW: 12.8 % (ref 11.5–15.5)
WBC: 9.8 10*3/uL (ref 4.0–10.5)
nRBC: 0 % (ref 0.0–0.2)

## 2020-07-13 LAB — URINALYSIS, COMPLETE (UACMP) WITH MICROSCOPIC
Bilirubin Urine: NEGATIVE
Glucose, UA: NEGATIVE mg/dL
Ketones, ur: 5 mg/dL — AB
Nitrite: NEGATIVE
Protein, ur: 30 mg/dL — AB
Specific Gravity, Urine: 1.027 (ref 1.005–1.030)
pH: 5 (ref 5.0–8.0)

## 2020-07-13 LAB — COMPREHENSIVE METABOLIC PANEL
ALT: 19 U/L (ref 0–44)
AST: 23 U/L (ref 15–41)
Albumin: 4.4 g/dL (ref 3.5–5.0)
Alkaline Phosphatase: 98 U/L (ref 38–126)
Anion gap: 11 (ref 5–15)
BUN: 16 mg/dL (ref 6–20)
CO2: 27 mmol/L (ref 22–32)
Calcium: 9.5 mg/dL (ref 8.9–10.3)
Chloride: 105 mmol/L (ref 98–111)
Creatinine, Ser: 0.79 mg/dL (ref 0.44–1.00)
GFR, Estimated: >60 mL/min (ref >60)
Glucose, Bld: 134 mg/dL — ABNORMAL HIGH (ref 70–99)
Potassium: 3.3 mmol/L — ABNORMAL LOW (ref 3.5–5.1)
Sodium: 143 mmol/L (ref 135–145)
Total Bilirubin: 0.5 mg/dL (ref 0.3–1.2)
Total Protein: 7.8 g/dL (ref 6.5–8.1)

## 2020-07-13 LAB — LIPASE, BLOOD: Lipase: 76 U/L — ABNORMAL HIGH (ref 11–51)

## 2020-07-13 LAB — POC URINE PREG, ED: Preg Test, Ur: NEGATIVE

## 2020-07-13 NOTE — ED Triage Notes (Signed)
Patient dx with COVID at the [facility] last Thursday. patient c/o generalized abdominal pain radiating to chest, nonproductive cough, fatigue.

## 2020-07-13 NOTE — Telephone Encounter (Signed)
Patient called and says she's having abdominal pain for the past 2 days. She says she has COVID and think it's from diarrhea. She says she's in quarantine until Saturday. She says the abdominal pain is at the bottom and shoots up to the chest at times. The pain is a 7 out of 10. She says she doesn't have a PCP. I advised to go to the ED, she verbalized understanding.   Reason for Disposition . Patient sounds very sick or weak to the triager  Answer Assessment - Initial Assessment Questions 1. LOCATION: "Where does it hurt?"      Lower 2. RADIATION: "Does the pain shoot anywhere else?" (e.g., chest, back)     Up to chest sometimes 3. ONSET: "When did the pain begin?" (e.g., minutes, hours or days ago)      2 days ago 4. SUDDEN: "Gradual or sudden onset?"     Gradual 5. PATTERN "Does the pain come and go, or is it constant?"    - If constant: "Is it getting better, staying the same, or worsening?"      (Note: Constant means the pain never goes away completely; most serious pain is constant and it progresses)     - If intermittent: "How long does it last?" "Do you have pain now?"     (Note: Intermittent means the pain goes away completely between bouts)     Constant 6. SEVERITY: "How bad is the pain?"  (e.g., Scale 1-10; mild, moderate, or severe)   - MILD (1-3): doesn't interfere with normal activities, abdomen soft and not tender to touch    - MODERATE (4-7): interferes with normal activities or awakens from sleep, tender to touch    - SEVERE (8-10): excruciating pain, doubled over, unable to do any normal activities      7 7. RECURRENT SYMPTOM: "Have you ever had this type of stomach pain before?" If Yes, ask: "When was the last time?" and "What happened that time?"      No 8. CAUSE: "What do you think is causing the stomach pain?"     Unsure 9. RELIEVING/AGGRAVATING FACTORS: "What makes it better or worse?" (e.g., movement, antacids, bowel movement)     Movement, on stomach or  back 10. OTHER SYMPTOMS: "Has there been any vomiting, diarrhea, constipation, or urine problems?"       COVID positive symptoms-diarrhea, weakness 11. PREGNANCY: "Is there any chance you are pregnant?" "When was your last menstrual period?"       No  Protocols used: ABDOMINAL PAIN - [facility]

## 2020-07-13 NOTE — ED Notes (Signed)
Pt has copy of +COVID test from [facility], [place]

## 2020-07-15 ENCOUNTER — Telehealth: Payer: Self-pay | Admitting: Emergency Medicine

## 2020-07-15 NOTE — Telephone Encounter (Signed)
Called patient due to left emergency department before provider exam to inquire about condition and follow up plans. Says she is doing okay, but mostly says she cant get her coughs out and it makes her short of breath.  I told her that she can alwayrs return here.

## 2020-07-16 ENCOUNTER — Emergency Department: Payer: Medicaid Other

## 2020-07-16 ENCOUNTER — Encounter: Payer: Self-pay | Admitting: Emergency Medicine

## 2020-07-16 ENCOUNTER — Emergency Department
Admission: EM | Admit: 2020-07-16 | Discharge: 2020-07-16 | Disposition: A | Discharge status: Home / Self Care | Payer: Medicaid Other | Attending: Emergency Medicine | Admitting: Emergency Medicine

## 2020-07-16 ENCOUNTER — Other Ambulatory Visit: Payer: Self-pay

## 2020-07-16 DIAGNOSIS — J208 Acute bronchitis due to other specified organisms: Secondary | ICD-10-CM | POA: Insufficient documentation

## 2020-07-16 DIAGNOSIS — U071 COVID-19: Secondary | ICD-10-CM | POA: Insufficient documentation

## 2020-07-16 DIAGNOSIS — Z79899 Other long term (current) drug therapy: Secondary | ICD-10-CM | POA: Diagnosis not present

## 2020-07-16 DIAGNOSIS — R059 Cough, unspecified: Secondary | ICD-10-CM | POA: Diagnosis present

## 2020-07-16 LAB — COMPREHENSIVE METABOLIC PANEL
ALT: 15 U/L (ref 0–44)
AST: 16 U/L (ref 15–41)
Albumin: 3.9 g/dL (ref 3.5–5.0)
Alkaline Phosphatase: 87 U/L (ref 38–126)
Anion gap: 10 (ref 5–15)
BUN: 9 mg/dL (ref 6–20)
CO2: 26 mmol/L (ref 22–32)
Calcium: 8.9 mg/dL (ref 8.9–10.3)
Chloride: 101 mmol/L (ref 98–111)
Creatinine, Ser: 0.69 mg/dL (ref 0.44–1.00)
GFR, Estimated: >60 mL/min (ref >60)
Glucose, Bld: 99 mg/dL (ref 70–99)
Potassium: 3.3 mmol/L — ABNORMAL LOW (ref 3.5–5.1)
Sodium: 137 mmol/L (ref 135–145)
Total Bilirubin: 0.6 mg/dL (ref 0.3–1.2)
Total Protein: 7.4 g/dL (ref 6.5–8.1)

## 2020-07-16 LAB — FIBRIN DERIVATIVES D-DIMER (ARMC ONLY): Fibrin derivatives D-dimer (ARMC): 225.53 ng/mL (FEU) (ref 0.00–499.00)

## 2020-07-16 LAB — CBC
HCT: 36.6 % (ref 36.0–46.0)
Hemoglobin: 12.2 g/dL (ref 12.0–15.0)
MCH: 29.8 pg (ref 26.0–34.0)
MCHC: 33.3 g/dL (ref 30.0–36.0)
MCV: 89.5 fL (ref 80.0–100.0)
Platelets: 354 10*3/uL (ref 150–400)
RBC: 4.09 MIL/uL (ref 3.87–5.11)
RDW: 12.4 % (ref 11.5–15.5)
WBC: 8.8 10*3/uL (ref 4.0–10.5)
nRBC: 0 % (ref 0.0–0.2)

## 2020-07-16 LAB — HCG, QUANTITATIVE, PREGNANCY: hCG, Beta Chain, Quant, S: 1 m[IU]/mL (ref <5)

## 2020-07-16 LAB — LIPASE, BLOOD: Lipase: 38 U/L (ref 11–51)

## 2020-07-16 MED ORDER — ONDANSETRON HCL 4 MG/2ML IJ SOLN
4.0000 mg | Freq: Once | INTRAMUSCULAR | Status: AC
  Administered 2020-07-16: 18:00:00 4 mg via INTRAVENOUS
  Filled 2020-07-16: qty 2

## 2020-07-16 MED ORDER — LACTATED RINGERS IV BOLUS
1000.0000 mL | Freq: Once | INTRAVENOUS | Status: AC
  Administered 2020-07-16: 18:00:00 1000 mL via INTRAVENOUS

## 2020-07-16 MED ORDER — KETOROLAC TROMETHAMINE 30 MG/ML IJ SOLN
15.0000 mg | INTRAMUSCULAR | Status: AC
  Administered 2020-07-16: 18:00:00 15 mg via INTRAVENOUS
  Filled 2020-07-16: qty 1

## 2020-07-16 MED ORDER — PREDNISONE 20 MG PO TABS: 40.0000 mg | ORAL_TABLET | Freq: Every day | ORAL | 0 refills | Status: DC

## 2020-07-16 MED ORDER — FAMOTIDINE 20 MG PO TABS: 20.0000 mg | ORAL_TABLET | Freq: Two times a day (BID) | ORAL | 0 refills | Status: DC

## 2020-07-16 MED ORDER — NAPROXEN 500 MG PO TABS: 500.0000 mg | ORAL_TABLET | Freq: Two times a day (BID) | ORAL | 0 refills | Status: DC

## 2020-07-16 MED ORDER — METHYLPREDNISOLONE SODIUM SUCC 125 MG IJ SOLR
80.0000 mg | INTRAMUSCULAR | Status: AC
  Administered 2020-07-16: 18:00:00 80 mg via INTRAVENOUS
  Filled 2020-07-16: qty 2

## 2020-07-16 MED ORDER — ONDANSETRON 4 MG PO TBDP: 4.0000 mg | ORAL_TABLET | Freq: Three times a day (TID) | ORAL | 0 refills | Status: DC | PRN

## 2020-07-16 NOTE — ED Notes (Signed)
Blue top sent to lab at this time 

## 2020-07-16 NOTE — Discharge Instructions (Signed)
Your lab tests and chest xray today were all okay. Take prednisone and naproxen to decrease inflammation, and take famotidine (Pepcid) to protect your stomach from excessive irritation.  Zofran can be helpful for nausea to allow for better hydration as well.

## 2020-07-16 NOTE — ED Provider Notes (Signed)
[facility] Emergency Department Provider Note  ____________________________________________  Time seen: Approximately 5:50 PM  I have reviewed the triage vital signs and the nursing notes.   HISTORY  Chief Complaint Cough, Covid Positive, and Laryngitis    HPI Sara Mcneil is a 24 y.o. female with noncontributory past medical history who reports being diagnosed with Covid on December 1.  She has been having gradually progressive worsening central chest discomfort described as tightness, nonradiating, worse with deep breathing, and hoarse voice. Not better by sitting upright/leaning forward.  Also having nonproductive cough and feeling of chest congestion.  She has some mild shortness of breath, but no DOE, not interfering with usual activity.  Also notes decreased appetite and oral intake and feeling dehydrated.  No dizziness or syncope.  No history of DVT or PE, no recent travel trauma hospitalization or surgery.      Past Medical History:  Diagnosis Date  . HPV (human papilloma virus) infection   . PID (acute pelvic inflammatory disease)   . Postpartum care following vaginal delivery 02/18/2018  . Preterm delivery      Patient Active Problem List   Diagnosis Date Noted  . Normal vaginal delivery 03/21/2020  . Postpartum care following vaginal delivery 03/21/2020  . Encounter for sterilization 03/20/2020  . Encounter for elective induction of labor 03/19/2020  . Susceptible to varicella (non-immune), currently pregnant 03/19/2020  . Labor and delivery indication for care or intervention 03/11/2020  . Braxton Hicks contractions   . Pelvic pain in pregnancy, antepartum, third trimester 02/24/2020  . Indication for care in labor and delivery, antepartum 02/10/2020  . Vaginal bleeding in pregnancy, third trimester 02/04/2020  . Abdominal trauma 11/05/2019  . Supervision of high risk pregnancy, antepartum 08/21/2019  . History of preterm  delivery, currently pregnant 08/21/2019  . Maternal iron deficiency anemia affecting pregnancy in third trimester, antepartum 01/13/2018  . Low maternal serum vitamin B12 01/13/2018  . Rubella non-immune status, antepartum 11/18/2017  . Frequent UTI 11/12/2017  . Anxiety 11/12/2017     Past Surgical History:  Procedure Laterality Date  . NO PAST SURGERIES    . TUBAL LIGATION Bilateral 03/20/2020   Procedure: POST PARTUM TUBAL LIGATION;  Surgeon: Conard Novak, MD;  Location: [facility] ORS;  Service: Gynecology;  Laterality: Bilateral;     Prior to Admission medications   Medication Sig Start Date End Date Taking? Authorizing Provider  busPIRone (BUSPAR) 5 MG tablet Increase dose, as before, from 10 mg twice daily to 20 mg bid.  Increase dose by 5 mg every 4 days. 05/31/20   Conard Novak, MD  famotidine (PEPCID) 20 MG tablet Take 1 tablet (20 mg total) by mouth 2 (two) times daily for 10 days. 07/16/20 07/26/20  Sharman Cheek, MD  ferrous sulfate 325 (65 FE) MG tablet Take 1 tablet (325 mg total) by mouth daily with breakfast. 03/21/20   Farrel Conners, CNM  naproxen (NAPROSYN) 500 MG tablet Take 1 tablet (500 mg total) by mouth 2 (two) times daily with a meal. 07/16/20   Sharman Cheek, MD  ondansetron (ZOFRAN ODT) 4 MG disintegrating tablet Take 1 tablet (4 mg total) by mouth every 8 (eight) hours as needed for nausea or vomiting. 07/16/20   Sharman Cheek, MD  predniSONE (DELTASONE) 20 MG tablet Take 2 tablets (40 mg total) by mouth daily. 07/17/20   Sharman Cheek, MD  sertraline (ZOLOFT) 50 MG tablet Take 0.5 tablet x 3 days then increase to 50 mgm daily. 03/21/20  Farrel Conners, CNM     Allergies Sulfa antibiotics, Iodine, and Other   Family History  Problem Relation Age of Onset  . Breast cancer Neg Hx   . Ovarian cancer Neg Hx     Social History Social History   Tobacco Use  . Smoking status: Never Smoker  . Smokeless tobacco: Never Used   Vaping Use  . Vaping Use: Never used  Substance Use Topics  . Alcohol use: Not Currently  . Drug use: Not Currently    Review of Systems  Constitutional:   No fever or chills.  ENT:   No sore throat. No rhinorrhea. Cardiovascular: Positive chest pain as above without syncope. Respiratory: Positive shortness of breath and nonproductive cough. Gastrointestinal:   Negative for abdominal pain, vomiting and diarrhea.  Musculoskeletal:   Negative for focal pain or swelling All other systems reviewed and are negative except as documented above in ROS and HPI.  ____________________________________________   PHYSICAL EXAM:  VITAL SIGNS: ED Triage Vitals  Enc Vitals Group     BP 07/16/20 1457 102/76     Pulse Rate 07/16/20 1457 (!) 108     Resp 07/16/20 1457 18     Temp 07/16/20 1457 98.7 F (37.1 C)     Temp Source 07/16/20 1457 Oral     SpO2 07/16/20 1457 99 %     Weight 07/16/20 1458 120 lb (54.4 kg)     Height 07/16/20 1458 5\' 8"  (1.727 m)     Head Circumference --      Peak Flow --      Pain Score 07/16/20 1457 6     Pain Loc --      Pain Edu? --      Excl. in GC? --     Vital signs reviewed, nursing assessments reviewed.   Constitutional:   Alert and oriented. Non-toxic appearance. Eyes:   Conjunctivae are normal. EOMI. PERRL. ENT      Head:   Normocephalic and atraumatic.      Nose:   Wearing a mask.      Mouth/Throat:   Wearing a mask.      Neck:   No meningismus. Full ROM. Hematological/Lymphatic/Immunilogical:   No cervical lymphadenopathy. Cardiovascular:   Tachycardia heart rate 105. Symmetric bilateral radial and DP pulses.  No murmurs. Cap refill less than 2 seconds. Respiratory:   Normal respiratory effort without tachypnea/retractions. Breath sounds are clear and equal bilaterally. No wheezes/rales/rhonchi. Gastrointestinal:   Soft and nontender. Non distended. There is no CVA tenderness.  No rebound, rigidity, or guarding. Musculoskeletal:   Normal  range of motion in all extremities. No joint effusions.  No lower extremity tenderness.  No edema. Neurologic:   Normal speech and language.  Motor grossly intact. No acute focal neurologic deficits are appreciated.  Skin:    Skin is warm, dry and intact. No rash noted.  No petechiae, purpura, or bullae.  ____________________________________________    LABS (pertinent positives/negatives) (all labs ordered are listed, but only abnormal results are displayed) Labs Reviewed  COMPREHENSIVE METABOLIC PANEL - Abnormal; Notable for the following components:      Result Value   Potassium 3.3 (*)    All other components within normal limits  CBC  LIPASE, BLOOD  HCG, QUANTITATIVE, PREGNANCY  FIBRIN DERIVATIVES D-DIMER ([facility] ONLY)   ____________________________________________   EKG    ____________________________________________    RADIOLOGY  DG Chest 2 View  Result Date: 07/16/2020 CLINICAL DATA:  Shortness of breath, cough, COVID positive  EXAM: CHEST - 2 VIEW COMPARISON:  07/13/2020 FINDINGS: The heart size and mediastinal contours are within normal limits. Both lungs are clear. The visualized skeletal structures are unremarkable. IMPRESSION: No acute abnormality of the lungs. Electronically Signed   By: Lauralyn Primes M.D.   On: 07/16/2020 16:12    ____________________________________________   PROCEDURES Procedures  ____________________________________________  DIFFERENTIAL DIAGNOSIS   COVID-19 pneumonitis/pleurisy, pharyngitis, costochondritis, pulmonary embolism, superimposed bacterial pneumonia, dehydration  CLINICAL IMPRESSION / ASSESSMENT AND PLAN / ED COURSE  Medications ordered in the ED: Medications  lactated ringers bolus 1,000 mL (1,000 mLs Intravenous New Bag/Given 07/16/20 1742)  ketorolac (TORADOL) 30 MG/ML injection 15 mg (15 mg Intravenous Given 07/16/20 1744)  ondansetron (ZOFRAN) injection 4 mg (4 mg Intravenous Given 07/16/20 1743)   methylPREDNISolone sodium succinate (SOLU-MEDROL) 125 mg/2 mL injection 80 mg (80 mg Intravenous Given 07/16/20 1743)    Pertinent labs & imaging results that were available during my care of the patient were reviewed by me and considered in my medical decision making (see chart for details).  Keiara ZAMANI CROCKER was evaluated in Emergency Department on 07/16/2020 for the symptoms described in the history of present illness. She was evaluated in the context of the global COVID-19 pandemic, which necessitated consideration that the patient might be at risk for infection with the SARS-CoV-2 virus that causes COVID-19. Institutional protocols and algorithms that pertain to the evaluation of patients at risk for COVID-19 are in a state of rapid change based on information released by regulatory bodies including the CDC and federal and state organizations. These policies and algorithms were followed during the patient's care in the ED.   Patient presents with pleuritic chest pain for greater than a week in the setting of Covid infection.  Most likely this is all viral inflammatory syndrome, but with prolonged symptoms, tachycardia, will need to risk stratify with a D-dimer.  Will give Toradol and Solu-Medrol now for anti-inflammatory effects for symptom relief.  Will give IV fluids for hydration.   ----------------------------------------- 7:02 PM on 07/16/2020 -----------------------------------------  Pt feels better.  D dimer low, lipase normal. Will dc on pred, naproxen, zofran, pepcid.  Doubt pericarditis/effusion, ACS, dissection.      ____________________________________________   FINAL CLINICAL IMPRESSION(S) / ED DIAGNOSES    Final diagnoses:  Viral bronchitis  COVID-19 virus infection     ED Discharge Orders         Ordered    predniSONE (DELTASONE) 20 MG tablet  Daily        07/16/20 1901    naproxen (NAPROSYN) 500 MG tablet  2 times daily with meals        07/16/20 1901     famotidine (PEPCID) 20 MG tablet  2 times daily        07/16/20 1901    ondansetron (ZOFRAN ODT) 4 MG disintegrating tablet  Every 8 hours PRN        07/16/20 1902          Portions of this note were generated with dragon dictation software. Dictation errors may occur despite best attempts at proofreading.   Sharman Cheek, MD 07/16/20 [phone]

## 2020-07-16 NOTE — ED Notes (Signed)
See triage note, pt reports sore throat, cough and congestion. Ambulatory to treatment room, NAD noted

## 2020-07-16 NOTE — ED Triage Notes (Signed)
Pt to ER with c/o increasing chest tightness and laryngitis after being diagnosed with COVID on 12/1.  Pt states chest tightness worse with coughing.  Pt states she feels like something needs to come up, but she can't get to to clear.  NAD noted at present.

## 2020-12-23 ENCOUNTER — Emergency Department: Payer: Medicaid Other

## 2020-12-23 ENCOUNTER — Other Ambulatory Visit: Payer: Self-pay

## 2020-12-23 DIAGNOSIS — R002 Palpitations: Secondary | ICD-10-CM | POA: Insufficient documentation

## 2020-12-23 DIAGNOSIS — G8929 Other chronic pain: Secondary | ICD-10-CM | POA: Insufficient documentation

## 2020-12-23 DIAGNOSIS — M545 Low back pain, unspecified: Secondary | ICD-10-CM | POA: Insufficient documentation

## 2020-12-23 DIAGNOSIS — R202 Paresthesia of skin: Secondary | ICD-10-CM | POA: Diagnosis not present

## 2020-12-23 DIAGNOSIS — R079 Chest pain, unspecified: Secondary | ICD-10-CM | POA: Diagnosis not present

## 2020-12-23 DIAGNOSIS — M79662 Pain in left lower leg: Secondary | ICD-10-CM | POA: Diagnosis not present

## 2020-12-23 LAB — BASIC METABOLIC PANEL
Anion gap: 8 (ref 5–15)
BUN: 19 mg/dL (ref 6–20)
CO2: 26 mmol/L (ref 22–32)
Calcium: 9.3 mg/dL (ref 8.9–10.3)
Chloride: 105 mmol/L (ref 98–111)
Creatinine, Ser: 0.75 mg/dL (ref 0.44–1.00)
GFR, Estimated: >60 mL/min (ref >60)
Glucose, Bld: 109 mg/dL — ABNORMAL HIGH (ref 70–99)
Potassium: 3.5 mmol/L (ref 3.5–5.1)
Sodium: 139 mmol/L (ref 135–145)

## 2020-12-23 LAB — TROPONIN I (HIGH SENSITIVITY)
Troponin I (High Sensitivity): <2 ng/L (ref <18)
Troponin I (High Sensitivity): <2 ng/L (ref <18)

## 2020-12-23 LAB — CBC
HCT: 36.1 % (ref 36.0–46.0)
Hemoglobin: 12.3 g/dL (ref 12.0–15.0)
MCH: 30.2 pg (ref 26.0–34.0)
MCHC: 34.1 g/dL (ref 30.0–36.0)
MCV: 88.7 fL (ref 80.0–100.0)
Platelets: 370 10*3/uL (ref 150–400)
RBC: 4.07 MIL/uL (ref 3.87–5.11)
RDW: 12.7 % (ref 11.5–15.5)
WBC: 5.5 10*3/uL (ref 4.0–10.5)
nRBC: 0 % (ref 0.0–0.2)

## 2020-12-23 NOTE — ED Triage Notes (Signed)
Pt is a 25 y/o who presents with a cc of centralized and L sided chest pain intermittent for several years but worsening over the last week. Pt reports tightness and reports "difficulty taking a deep breath in". Denies known cardiac hx. Pt also reports L leg "tingling" for 3-4 days. Pt states her L calf was numb for a few days but then the sensation returned.

## 2020-12-24 ENCOUNTER — Emergency Department: Payer: Medicaid Other

## 2020-12-24 ENCOUNTER — Emergency Department
Admission: EM | Admit: 2020-12-24 | Discharge: 2020-12-24 | Disposition: A | Discharge status: Home / Self Care | Payer: Medicaid Other | Attending: Emergency Medicine | Admitting: Emergency Medicine

## 2020-12-24 DIAGNOSIS — G8929 Other chronic pain: Secondary | ICD-10-CM

## 2020-12-24 DIAGNOSIS — R200 Anesthesia of skin: Secondary | ICD-10-CM

## 2020-12-24 LAB — POC URINE PREG, ED: Preg Test, Ur: NEGATIVE

## 2020-12-24 LAB — D-DIMER, QUANTITATIVE: D-Dimer, Quant: <0.27 ug/mL-FEU (ref 0.00–0.50)

## 2020-12-24 MED ORDER — KETOROLAC TROMETHAMINE 30 MG/ML IJ SOLN
30.0000 mg | Freq: Once | INTRAMUSCULAR | Status: AC
  Administered 2020-12-24: 30 mg via INTRAVENOUS
  Filled 2020-12-24: qty 1

## 2020-12-24 NOTE — ED Notes (Signed)
Patient currently in radiology.

## 2020-12-24 NOTE — ED Provider Notes (Signed)
[facility] Emergency Department Provider Note  ____________________________________________   Event Date/Time   First MD Initiated Contact with Patient 12/24/20 0031     (approximate)  I have reviewed the triage vital signs and the nursing notes.   HISTORY  Chief Complaint Chest Pain    HPI Sara Mcneil is a 25 y.o. female with no significant past medical history who presents to the emergency department with chest pain in the middle and left side of her chest that has been worsening over the past week.  She has had constant chest pain she states since she was a child.  She describes it as feeling like "closing in".  States it is hard to take a deep breath in because of pain.  No fevers, cough, vomiting.  She states that she felt her heart racing this morning and did have some nausea.  She was seen by her pediatrician once as a child for this.  She has not seen a doctor since.  She states she became more concerned when she started having left leg pain and numbness.  States she is having some pain in the left calf but no swelling.  States 2 days ago her entire leg was numb and it has improved and feels mostly numb from the knee down.  No injury.  Does have mild frontal headache currently for the past week.  No history of chronic headaches, migraines.  Also reports chronic lower back pain for years that is unchanged.  She denies any incontinence, urinary retention.  No previous back surgeries or epidural injections.  No dysuria, hematuria.  No history of PE, DVT, exogenous estrogen use, recent fractures, trauma, prolonged travel or other immobilization.  States she did just have a baby about 10 months ago and had a BTL at that time.  She is not currently breast-feeding.     Past Medical History:  Diagnosis Date  . HPV (human papilloma virus) infection   . PID (acute pelvic inflammatory disease)   . Postpartum care following vaginal delivery 02/18/2018  .  Preterm delivery     Patient Active Problem List   Diagnosis Date Noted  . Normal vaginal delivery 03/21/2020  . Postpartum care following vaginal delivery 03/21/2020  . Encounter for sterilization 03/20/2020  . Encounter for elective induction of labor 03/19/2020  . Susceptible to varicella (non-immune), currently pregnant 03/19/2020  . Labor and delivery indication for care or intervention 03/11/2020  . Braxton Hicks contractions   . Pelvic pain in pregnancy, antepartum, third trimester 02/24/2020  . Indication for care in labor and delivery, antepartum 02/10/2020  . Vaginal bleeding in pregnancy, third trimester 02/04/2020  . Abdominal trauma 11/05/2019  . Supervision of high risk pregnancy, antepartum 08/21/2019  . History of preterm delivery, currently pregnant 08/21/2019  . Maternal iron deficiency anemia affecting pregnancy in third trimester, antepartum 01/13/2018  . Low maternal serum vitamin B12 01/13/2018  . Rubella non-immune status, antepartum 11/18/2017  . Frequent UTI 11/12/2017  . Anxiety 11/12/2017    Past Surgical History:  Procedure Laterality Date  . NO PAST SURGERIES    . TUBAL LIGATION Bilateral 03/20/2020   Procedure: POST PARTUM TUBAL LIGATION;  Surgeon: Conard Novak, MD;  Location: [facility] ORS;  Service: Gynecology;  Laterality: Bilateral;    Prior to Admission medications   Medication Sig Start Date End Date Taking? Authorizing Provider  busPIRone (BUSPAR) 5 MG tablet Increase dose, as before, from 10 mg twice daily to 20 mg bid.  Increase dose by 5 mg every 4 days. 05/31/20   Conard Novak, MD  famotidine (PEPCID) 20 MG tablet Take 1 tablet (20 mg total) by mouth 2 (two) times daily for 10 days. 07/16/20 07/26/20  Sharman Cheek, MD  ferrous sulfate 325 (65 FE) MG tablet Take 1 tablet (325 mg total) by mouth daily with breakfast. 03/21/20   Farrel Conners, CNM  naproxen (NAPROSYN) 500 MG tablet Take 1 tablet (500 mg total) by mouth 2 (two)  times daily with a meal. 07/16/20   Sharman Cheek, MD  ondansetron (ZOFRAN ODT) 4 MG disintegrating tablet Take 1 tablet (4 mg total) by mouth every 8 (eight) hours as needed for nausea or vomiting. 07/16/20   Sharman Cheek, MD  predniSONE (DELTASONE) 20 MG tablet Take 2 tablets (40 mg total) by mouth daily. 07/17/20   Sharman Cheek, MD  sertraline (ZOLOFT) 50 MG tablet Take 0.5 tablet x 3 days then increase to 50 mgm daily. 03/21/20   Farrel Conners, CNM    Allergies Sulfa antibiotics, Iodine, and Other  Family History  Problem Relation Age of Onset  . Breast cancer Neg Hx   . Ovarian cancer Neg Hx     Social History Social History   Tobacco Use  . Smoking status: Never Smoker  . Smokeless tobacco: Never Used  Vaping Use  . Vaping Use: Never used  Substance Use Topics  . Alcohol use: Not Currently  . Drug use: Not Currently    Review of Systems Constitutional: No fever. Eyes: No visual changes. ENT: No sore throat. Cardiovascular: + chest pain. Respiratory: + shortness of breath. Gastrointestinal: No nausea, vomiting, diarrhea. Genitourinary: Negative for dysuria. Musculoskeletal: + for back pain. Skin: Negative for rash. Neurological: Negative for focal weakness or numbness.  ____________________________________________   PHYSICAL EXAM:  VITAL SIGNS: ED Triage Vitals [12/23/20 2008]  Enc Vitals Group     BP 112/74     Pulse Rate 99     Resp 16     Temp 98.6 F (37 C)     Temp Source Oral     SpO2 100 %     Weight 119 lb 0.8 oz (54 kg)     Height  (1.753 m)     Head Circumference      Peak Flow      Pain Score 6     Pain Loc      Pain Edu?      Excl. in GC?    CONSTITUTIONAL: Alert and oriented and responds appropriately to questions. Well-appearing; well-nourished HEAD: Normocephalic EYES: Conjunctivae clear, pupils appear equal, EOM appear intact ENT: normal nose; moist mucous membranes NECK: Supple, normal ROM CARD: RRR; S1  and S2 appreciated; no murmurs, no clicks, no rubs, no gallops RESP: Normal chest excursion without splinting or tachypnea; breath sounds clear and equal bilaterally; no wheezes, no rhonchi, no rales, no hypoxia or respiratory distress, speaking full sentences ABD/GI: Normal bowel sounds; non-distended; soft, non-tender, no rebound, no guarding, no peritoneal signs, no hepatosplenomegaly BACK: The back appears normal, no midline spinal tenderness or step-off or deformity EXT: Normal ROM in all joints; no deformity noted, no edema; no cyanosis, patient does have some left calf tenderness but no associated swelling, 2+ DP pulses bilaterally SKIN: Normal color for age and race; warm; no rash on exposed skin NEURO: Moves all extremities equally, reports diminished sensation in the left leg from knee down, no weakness noted, normal reflexes, no clonus, no saddle anesthesia, normal speech,  cranial nerves II to XII intact PSYCH: The patient's mood and manner are appropriate.  ____________________________________________   LABS (all labs ordered are listed, but only abnormal results are displayed)  Labs Reviewed  BASIC METABOLIC PANEL - Abnormal; Notable for the following components:      Result Value   Glucose, Bld 109 (*)    All other components within normal limits  CBC  D-DIMER, QUANTITATIVE  POC URINE PREG, ED  TROPONIN I (HIGH SENSITIVITY)  TROPONIN I (HIGH SENSITIVITY)   ____________________________________________  EKG   EKG Interpretation  Date/Time:  Friday Dec 23 2020 20:01:44 EDT Ventricular Rate:  95 PR Interval:  134 QRS Duration: 82 QT Interval:  342 QTC Calculation: 429 R Axis:   87 Text Interpretation: Normal sinus rhythm Nonspecific T wave abnormality Abnormal ECG No significant change since last tracing Confirmed by Rochele RaringWard, Ranya Fiddler [phone] on 12/24/2020 12:36:47 AM       ____________________________________________  RADIOLOGY Normajean BaxterI, Lesleyann Fichter, personally viewed  and evaluated these images (plain radiographs) as part of my medical decision making, as well as reviewing the written report by the radiologist.  ED MD interpretation: Chest x-ray clear.  Official radiology report(s): DG Chest 2 View  Result Date: 12/23/2020 CLINICAL DATA:  Chest pain EXAM: CHEST - 2 VIEW COMPARISON:  07/16/2020 FINDINGS: The heart size and mediastinal contours are within normal limits. Both lungs are clear. Thoracolumbar scoliosis. IMPRESSION: No active cardiopulmonary disease. Electronically Signed   By: Marlan Palauharles  Clark M.D.   On: 12/23/2020 20:25   MR BRAIN WO CONTRAST  Result Date: 12/24/2020 CLINICAL DATA:  Left lower extremity tingling and numbness EXAM: MRI HEAD WITHOUT CONTRAST TECHNIQUE: Multiplanar, multiecho pulse sequences of the brain and surrounding structures were obtained without intravenous contrast. COMPARISON:  None. FINDINGS: Brain: No acute infarct, mass effect or extra-axial collection. No acute or chronic hemorrhage. Normal white matter signal, parenchymal volume and CSF spaces. The midline structures are normal. Vascular: Major flow voids are preserved. Skull and upper cervical spine: Normal calvarium and skull base. Visualized upper cervical spine and soft tissues are normal. Sinuses/Orbits:No paranasal sinus fluid levels or advanced mucosal thickening. No mastoid or middle ear effusion. Normal orbits. IMPRESSION: Normal brain MRI. Electronically Signed   By: Deatra RobinsonKevin  Herman M.D.   On: 12/24/2020 02:33   MR LUMBAR SPINE WO CONTRAST  Result Date: 12/24/2020 CLINICAL DATA:  Left lower extremity numbness and tingling EXAM: MRI LUMBAR SPINE WITHOUT CONTRAST TECHNIQUE: Multiplanar, multisequence MR imaging of the lumbar spine was performed. No intravenous contrast was administered. COMPARISON:  None. FINDINGS: Segmentation:  Standard. Alignment:  Physiologic. Vertebrae:  No fracture, evidence of discitis, or bone lesion. Conus medullaris and cauda equina: Conus  extends to the L1 level. Conus and cauda equina appear normal. Paraspinal and other soft tissues: Negative. Disc levels: There is mild disc desiccation and height loss at L5-S1 without spinal canal or neural foraminal stenosis. Otherwise, the lumbar disc levels are normal. IMPRESSION: Mild disc desiccation and height loss at L5-S1. Otherwise normal lumbar spine. No spinal canal or neural foraminal stenosis. Electronically Signed   By: Deatra RobinsonKevin  Herman M.D.   On: 12/24/2020 02:36   US Venous Img Lower Unilateral Left  Result Date: 12/24/2020 CLINICAL DATA:  Left leg pain and numbness. EXAM: Left LOWER EXTREMITY VENOUS DOPPLER ULTRASOUND TECHNIQUE: Gray-scale sonography with compression, as well as color and duplex ultrasound, were performed to evaluate the deep venous system(s) from the level of the common femoral vein through the popliteal and proximal calf veins. COMPARISON:  None. FINDINGS: VENOUS Normal compressibility of the common femoral, superficial femoral, and popliteal veins, as well as the visualized calf veins. Visualized portions of profunda femoral vein and great saphenous vein unremarkable. No filling defects to suggest DVT on grayscale or color Doppler imaging. Doppler waveforms show normal direction of venous flow, normal respiratory plasticity and response to augmentation. Limited views of the contralateral common femoral vein are unremarkable. OTHER None. Limitations: none IMPRESSION: No left lower extremity deep venous thrombosis. Electronically Signed   By: Tish Frederickson M.D.   On: 12/24/2020 01:39    ____________________________________________   PROCEDURES  Procedure(s) performed (including Critical Care):  Procedures  ____________________________________________   INITIAL IMPRESSION / ASSESSMENT AND PLAN / ED COURSE  As part of my medical decision making, I reviewed the following data within the electronic MEDICAL RECORD NUMBER Nursing notes reviewed and incorporated, Labs  reviewed , EKG interpreted , Old EKG reviewed, Old chart reviewed, Radiograph reviewed  and Notes from prior ED visits         Patient here with complaints of chest pain.  Symptoms have been ongoing for significant period of time but complains of them worsening over the past week.  Low suspicion for ACS.  She has had 2 negative troponins in triage.  Her EKG is nonischemic.  We will add on D-dimer to rule out PE.  Will obtain venous Doppler of the left lower extremity given complaints of calf pain.  Given complaints of headache, lower back pain and left leg numbness, will obtain MRI of the brain and lumbar spine without contrast.  Will give Toradol and reassess.  Differential includes intracranial mass, CVA, migraine, herniated disc, spinal stenosis, cauda equina.  Low suspicion for infectious etiology.  ED PROGRESS  Patient's D-dimer is negative.  Ultrasound of the left lower extremity shows no DVT.  MRI of the brain shows no acute abnormality.  MRI of the lumbar spine shows mild disc desiccation and height loss at L5-S1 but otherwise normal.  No spinal canal or neural foraminal stenosis.  Urine pregnancy test negative.  Patient reports feeling better.  Will discharge home with outpatient PCP and neurology follow-up.  Doubt MS, Guillain-Barr.  At this time, I do not feel there is any life-threatening condition present. I have reviewed, interpreted and discussed all results (EKG, imaging, lab, urine as appropriate) and exam findings with patient/family. I have reviewed nursing notes and appropriate previous records.  I feel the patient is safe to be discharged home without further emergent workup and can continue workup as an outpatient as needed. Discussed usual and customary return precautions. Patient/family verbalize understanding and are comfortable with this plan.  Outpatient follow-up has been provided as needed. All questions have been  answered.  ____________________________________________   FINAL CLINICAL IMPRESSION(S) / ED DIAGNOSES  Final diagnoses:  Chronic chest pain  Left leg numbness     ED Discharge Orders    None      *Please note:  Sara Mcneil was evaluated in Emergency Department on 12/24/2020 for the symptoms described in the history of present illness. She was evaluated in the context of the global COVID-19 pandemic, which necessitated consideration that the patient might be at risk for infection with the SARS-CoV-2 virus that causes COVID-19. Institutional protocols and algorithms that pertain to the evaluation of patients at risk for COVID-19 are in a state of rapid change based on information released by regulatory bodies including the CDC and federal and state organizations. These policies and algorithms were followed  during the patient's care in the ED.  Some ED evaluations and interventions may be delayed as a result of limited staffing during and the pandemic.*   Note:  This document was prepared using Dragon voice recognition software and may include unintentional dictation errors.   Stiles Maxcy, Layla Maw, DO 12/24/20 [phone]

## 2020-12-24 NOTE — Discharge Instructions (Addendum)
Steps to find a Primary Care Provider (PCP):  Call [phone] or [phone] to access "[place] Find a Doctor Service."  2.  You may also go on the [facility] website at [email or web address]  

## 2021-03-22 ENCOUNTER — Encounter: Payer: Self-pay | Admitting: Diagnostic Neuroimaging

## 2021-03-22 ENCOUNTER — Other Ambulatory Visit: Payer: Self-pay

## 2021-03-22 ENCOUNTER — Ambulatory Visit: Payer: Medicaid Other | Admitting: Diagnostic Neuroimaging

## 2021-03-22 VITALS — BP 99/70 | HR 72 | Ht 69.0 in | Wt 117.1 lb

## 2021-03-22 DIAGNOSIS — R202 Paresthesia of skin: Secondary | ICD-10-CM

## 2021-03-22 DIAGNOSIS — R200 Anesthesia of skin: Secondary | ICD-10-CM

## 2021-03-22 NOTE — Progress Notes (Signed)
[facility]  PATIENT: Sara Mcneil DOB: 1995-11-28  REFERRING CLINICIAN: No ref. provider found HISTORY FROM: patient  REASON FOR VISIT: new consult    HISTORICAL  CHIEF COMPLAINT:  Chief Complaint  Patient presents with   New Patient (Initial Visit)    Rm 6 alone, ED referral for left leg pain and numbness. Pt reports she went to the ED on 12/22/2020 for these sx imaging in epic.     HISTORY OF PRESENT ILLNESS:   25 year old female with low back pain and left leg numbness.  Patient has chronic low back pain since childhood.  She has had evaluations in the past with no specific explanation.  She had pregnancies in 2016, 2019, 2021, and back pain symptoms increased with each of the pregnancies.  Patient had increasing chest pain, dyspnea on exertion, difficulty breathing on 12/24/2020.  She went to emergency room for evaluation.  She had also been having numbness in her left lower extremity from her knee down to her foot.  No prodromal accidents injuries or traumas.  She had MRI of the brain and lumbar spine in the emergency room which were unremarkable.  Since that time symptoms have been fairly stable.  She still feels some numbness in her left lower extremity.   REVIEW OF SYSTEMS: Full 14 system review of systems performed and negative with exception of: As per HPI.  ALLERGIES: Allergies  Allergen Reactions   Sulfa Antibiotics Hives   Iodine    Other Hives    Itching and small hives s/p iodine injection    HOME MEDICATIONS: Outpatient Medications Prior to Visit  Medication Sig Dispense Refill   busPIRone (BUSPAR) 5 MG tablet Increase dose, as before, from 10 mg twice daily to 20 mg bid.  Increase dose by 5 mg every 4 days. (Patient not taking: Reported on 03/22/2021) 320 tablet 1   famotidine (PEPCID) 20 MG tablet Take 1 tablet (20 mg total) by mouth 2 (two) times daily for 10 days. 20 tablet 0   ferrous sulfate 325 (65 FE) MG tablet Take 1  tablet (325 mg total) by mouth daily with breakfast. (Patient not taking: Reported on 03/22/2021) 30 tablet 1   naproxen (NAPROSYN) 500 MG tablet Take 1 tablet (500 mg total) by mouth 2 (two) times daily with a meal. (Patient not taking: Reported on 03/22/2021) 20 tablet 0   ondansetron (ZOFRAN ODT) 4 MG disintegrating tablet Take 1 tablet (4 mg total) by mouth every 8 (eight) hours as needed for nausea or vomiting. (Patient not taking: Reported on 03/22/2021) 20 tablet 0   predniSONE (DELTASONE) 20 MG tablet Take 2 tablets (40 mg total) by mouth daily. (Patient not taking: Reported on 03/22/2021) 8 tablet 0   sertraline (ZOLOFT) 50 MG tablet Take 0.5 tablet x 3 days then increase to 50 mgm daily. (Patient not taking: Reported on 03/22/2021) 30 tablet 2   No facility-administered medications prior to visit.    PAST MEDICAL HISTORY: Past Medical History:  Diagnosis Date   HPV (human papilloma virus) infection    PID (acute pelvic inflammatory disease)    Postpartum care following vaginal delivery 02/18/2018   Preterm delivery     PAST SURGICAL HISTORY: Past Surgical History:  Procedure Laterality Date   NO PAST SURGERIES     TUBAL LIGATION Bilateral 03/20/2020   Procedure: POST PARTUM TUBAL LIGATION;  Surgeon: Conard Novak, MD;  Location: [facility] ORS;  Service: Gynecology;  Laterality: Bilateral;    FAMILY HISTORY: Family  History  Problem Relation Age of Onset   Breast cancer Neg Hx    Ovarian cancer Neg Hx     SOCIAL HISTORY: Social History   Socioeconomic History   Marital status: Single    Spouse name: Not on file   Number of children: Not on file   Years of education: Not on file   Highest education level: Not on file  Occupational History   Not on file  Tobacco Use   Smoking status: Never   Smokeless tobacco: Never  Vaping Use   Vaping Use: Never used  Substance and Sexual Activity   Alcohol use: Not Currently   Drug use: Not Currently   Sexual activity: Yes     Birth control/protection: Surgical    Comment: BTL  Other Topics Concern   Not on file  Social History Narrative   Not on file   Social Determinants of Health   Financial Resource Strain: Not on file  Food Insecurity: Not on file  Transportation Needs: Not on file  Physical Activity: Not on file  Stress: Not on file  Social Connections: Not on file  Intimate Partner Violence: Not on file     PHYSICAL EXAM  GENERAL EXAM/CONSTITUTIONAL: Vitals:  Vitals:   03/22/21 1143  BP: 99/70  Pulse: 72  Weight: 117 lb 2 oz (53.1 kg)  Height: 5\' 9"  (1.753 m)   Body mass index is 17.3 kg/m. Wt Readings from Last 3 Encounters:  03/22/21 117 lb 2 oz (53.1 kg)  12/23/20 119 lb 0.8 oz (54 kg)  07/16/20 120 lb (54.4 kg)   Patient is in no distress; well developed, nourished and groomed; neck is supple  CARDIOVASCULAR: Examination of carotid arteries is normal; no carotid bruits Regular rate and rhythm, no murmurs Examination of peripheral vascular system by observation and palpation is normal  EYES: Ophthalmoscopic exam of optic discs and posterior segments is normal; no papilledema or hemorrhages No results found.  MUSCULOSKELETAL: Gait, strength, tone, movements noted in Neurologic exam below  NEUROLOGIC: MENTAL STATUS:  No flowsheet data found. awake, alert, oriented to person, place and time recent and remote memory intact normal attention and concentration language fluent, comprehension intact, naming intact fund of knowledge appropriate  CRANIAL NERVE:  2nd - no papilledema on fundoscopic exam 2nd, 3rd, 4th, 6th - pupils equal and reactive to light, visual fields full to confrontation, extraocular muscles intact, no nystagmus 5th - facial sensation symmetric 7th - facial strength symmetric 8th - hearing intact 9th - palate elevates symmetrically, uvula midline 11th - shoulder shrug symmetric 12th - tongue protrusion midline  MOTOR:  normal bulk and tone, full  strength in the BUE, BLE  SENSORY:  normal and symmetric to light touch, pinprick, temperature, vibration; EXCEPT SLIGHTLY DECR IN LEFT FOOT / ANKLE  COORDINATION:  finger-nose-finger, fine finger movements normal  REFLEXES:  deep tendon reflexes present and symmetric  GAIT/STATION:  narrow based gait     DIAGNOSTIC DATA (LABS, IMAGING, TESTING) - I reviewed patient records, labs, notes, testing and imaging myself where available.  Lab Results  Component Value Date   WBC 5.5 12/23/2020   HGB 12.3 12/23/2020   HCT 36.1 12/23/2020   MCV 88.7 12/23/2020   PLT 370 12/23/2020      Component Value Date/Time   NA 139 12/23/2020 2010   K 3.5 12/23/2020 2010   CL 105 12/23/2020 2010   CO2 26 12/23/2020 2010   GLUCOSE 109 (H) 12/23/2020 2010   BUN 19 12/23/2020  2010   CREATININE 0.75 12/23/2020 2010   CALCIUM 9.3 12/23/2020 2010   PROT 7.4 07/16/2020 1501   ALBUMIN 3.9 07/16/2020 1501   AST 16 07/16/2020 1501   ALT 15 07/16/2020 1501   ALKPHOS 87 07/16/2020 1501   BILITOT 0.6 07/16/2020 1501   GFRNONAA >60 12/23/2020 2010   GFRAA >60 11/05/2019 2110   No results found for: CHOL, HDL, LDLCALC, LDLDIRECT, TRIG, CHOLHDL No results found for: VQQV9D No results found for: VITAMINB12 No results found for: TSH  12/24/2020 MRI brain without [I reviewed images myself and agree with interpretation. -VRP]  -Normal  12/24/2020 MRI lumbar spine without [I reviewed images myself and agree with interpretation. -VRP]  -Unremarkable     ASSESSMENT AND PLAN  25 y.o. year old female here with chronic low back pain since childhood, with new onset of left lower extremity numbness in May 2022.  MRI brain and lumbar spine are unremarkable.  Mild sensory changes in neurologic exam, but otherwise unremarkable.  Could represent peripheral nerve compression due to thin body habitus and leg crossing versus other cause.  Recommend monitor symptoms for now and may consider EMG nerve conduction  study in future.  Dx:  1. Numbness and tingling of left leg       PLAN:  LEFT LEG NUMBNESS (May 2022) - possible peripheral nerve irritation; mild; recommend conservative mgmt for now; avoid leg crossing or pressure at the left knee - may consider EMG/NCS if symptoms worsen or fail to improve over next 4-6 weeks  Return for pending if symptoms worsen or fail to improve.    Suanne Marker, MD 03/22/2021, 6:52 PM Certified in Neurology, Neurophysiology and Neuroimaging  [facility] [address] [place], [place] [address] [phone]

## 2021-03-22 NOTE — Patient Instructions (Signed)
LEFT LEG NUMBNESS (May 2022) - possible peripheral nerve irritation; mild; recommend conservative mgmt for now; avoid leg crossing or pressure at the left knee - may consider EMG/NCS if symptoms worsen or fail to improve over next 4-6 weeks

## 2021-09-24 IMAGING — US US EXTREM LOW VENOUS*L*
1 series · 14 of 24 positions shown · non-contrast
Comparison: None.

CLINICAL DATA: Left leg pain and numbness.

EXAM:
Left LOWER EXTREMITY VENOUS DOPPLER ULTRASOUND
TECHNIQUE: Gray-scale sonography with compression, as well as color and duplex
ultrasound, were performed to evaluate the deep venous system(s)
from the level of the common femoral vein through the popliteal and
proximal calf veins.

[Series 1: us venous img lower uni left (dvt) · portal-venous · 14 of 27 slices shown]
[im 1/27]
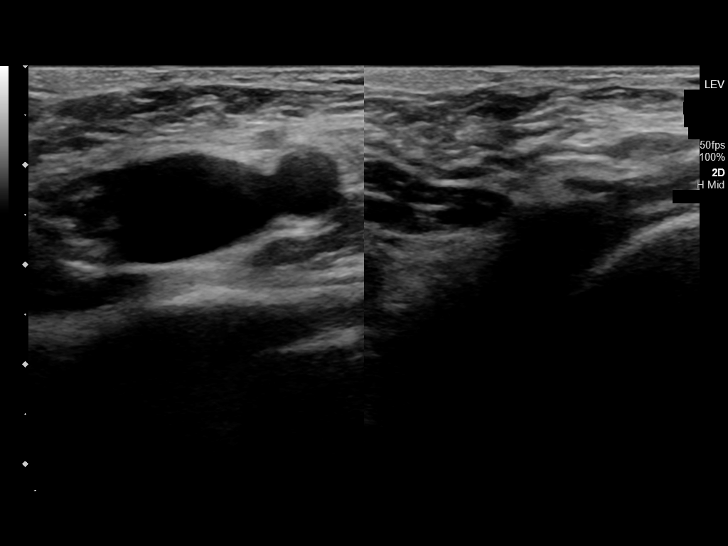
[im 3/27]
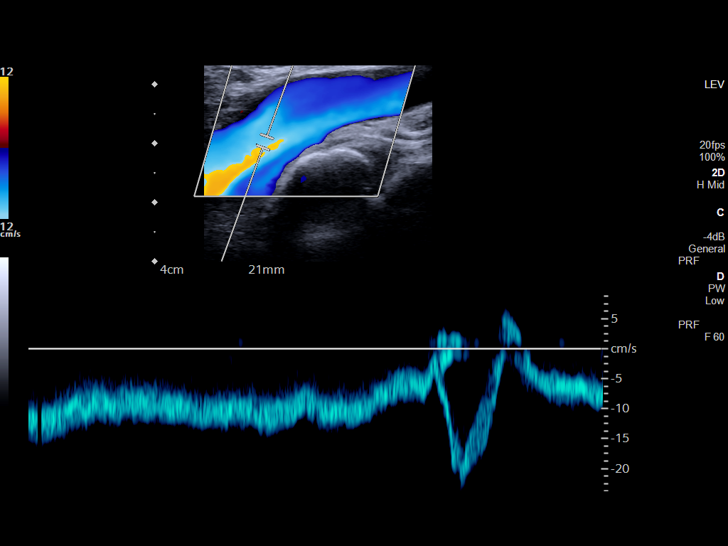
[im 5/27]
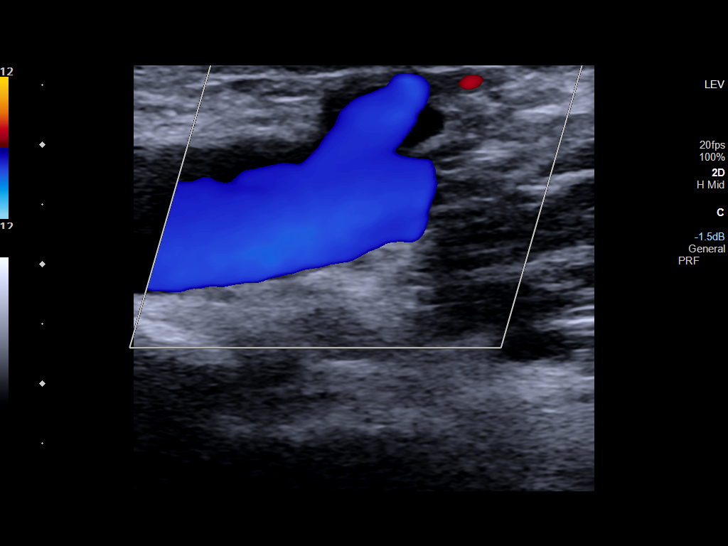
[im 7/27]
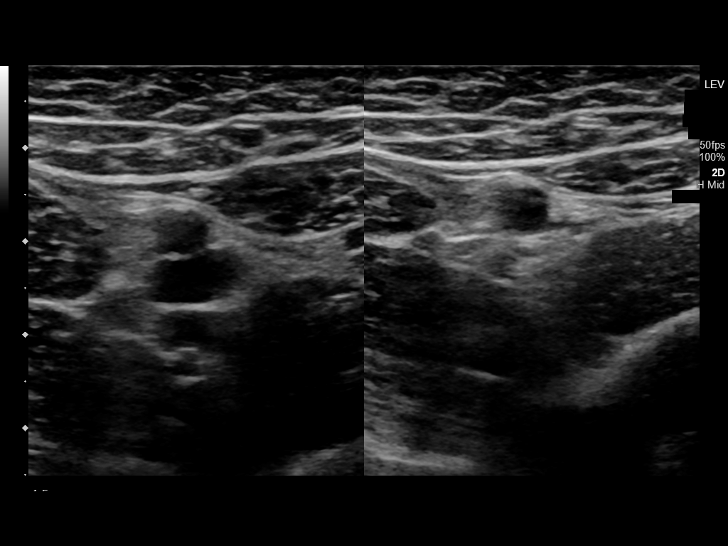
[im 8/27]
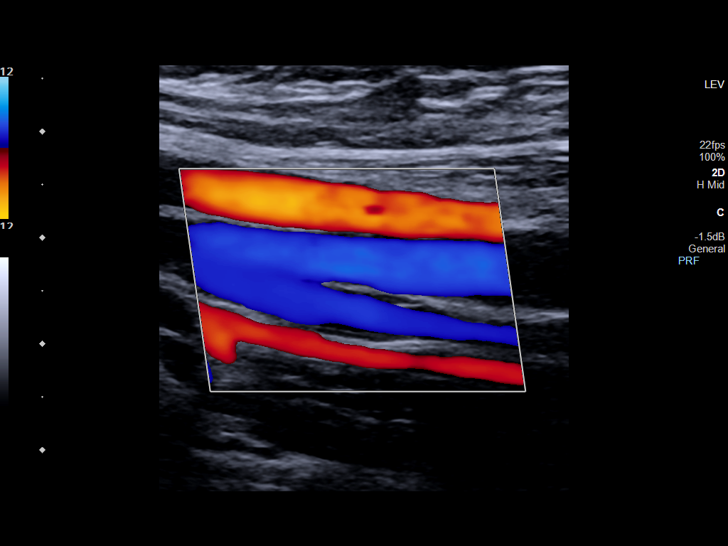
[im 11/27]
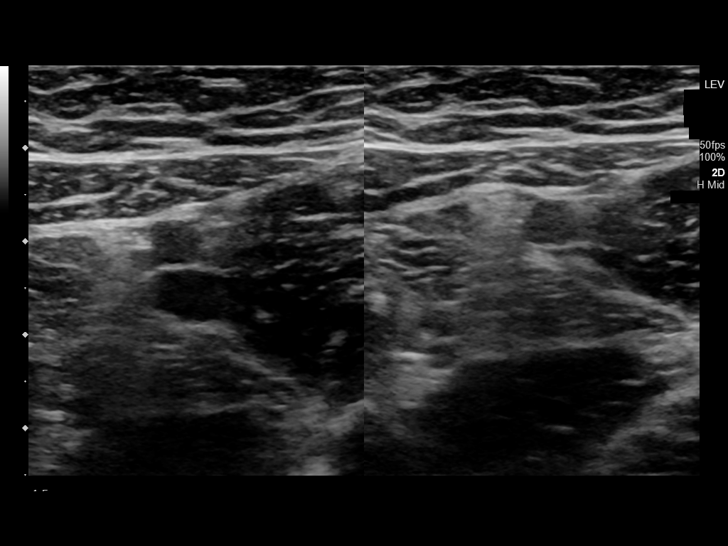
[im 13/27]
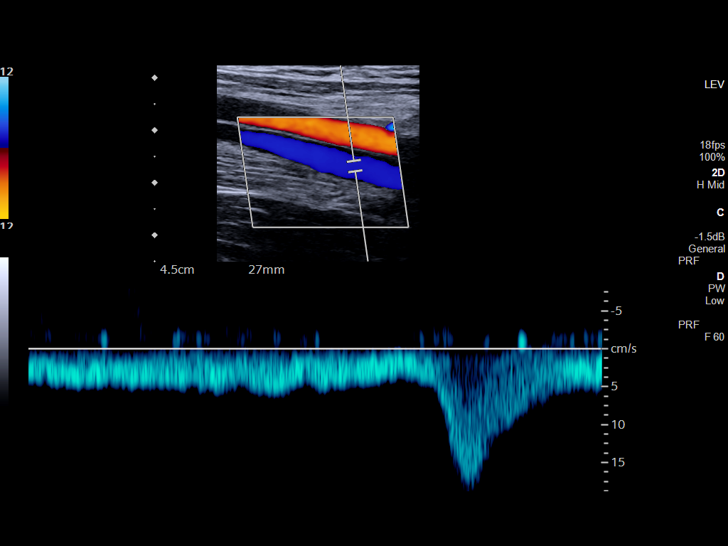
[im 14/27]
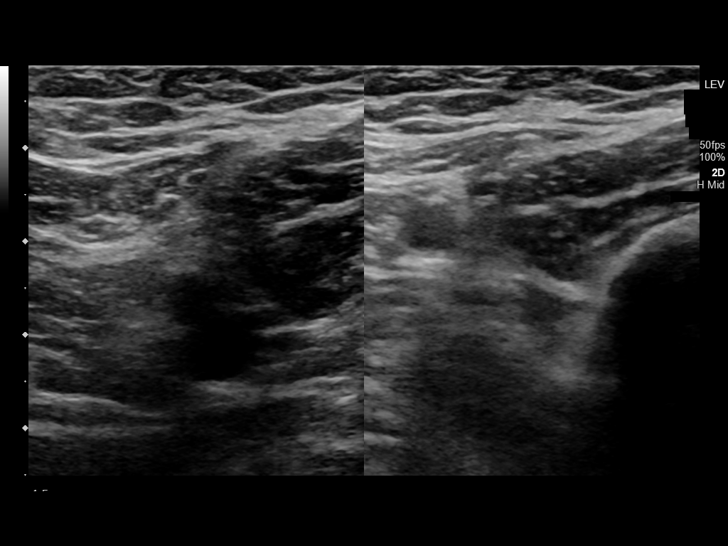
[im 16/27]
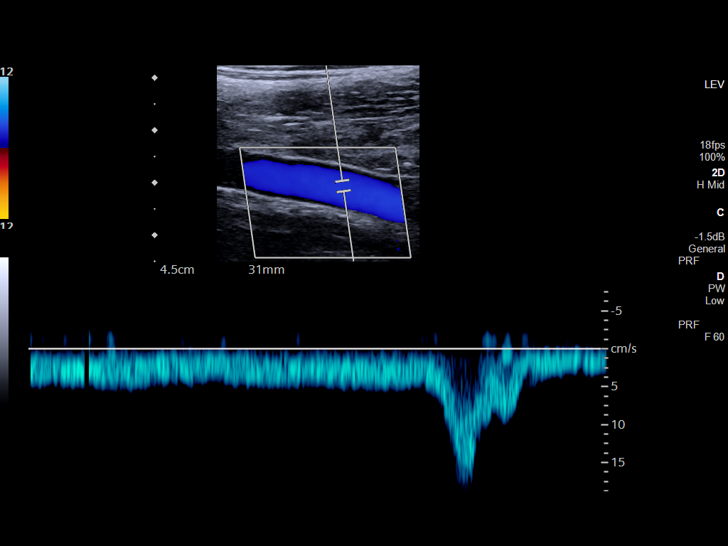
[im 19/27]
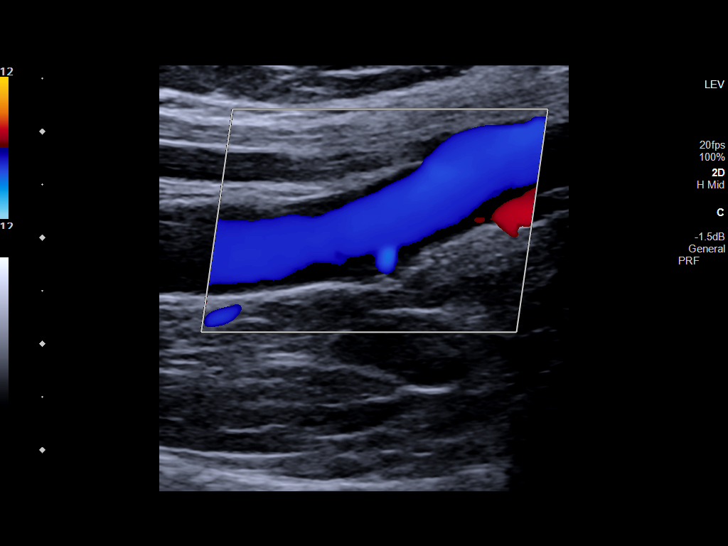
[im 21/27]
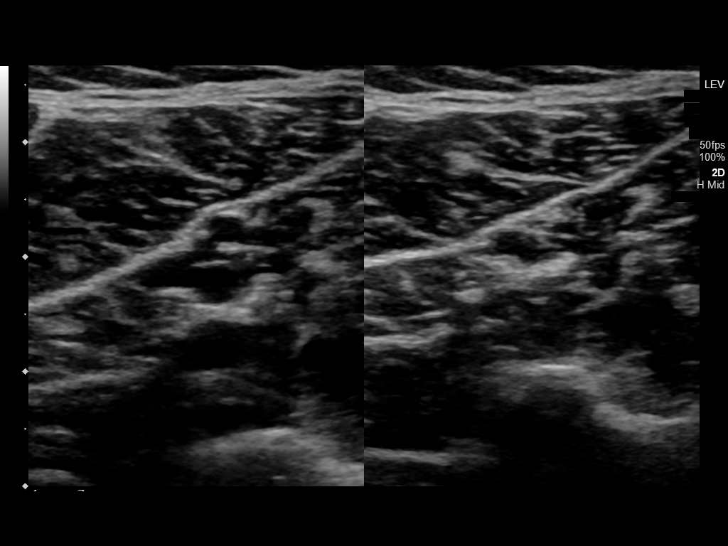
[im 22/27]
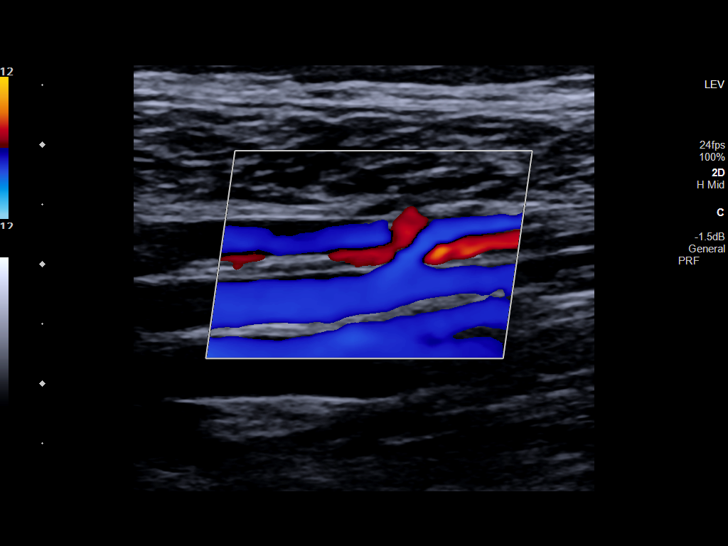
[im 24/27]
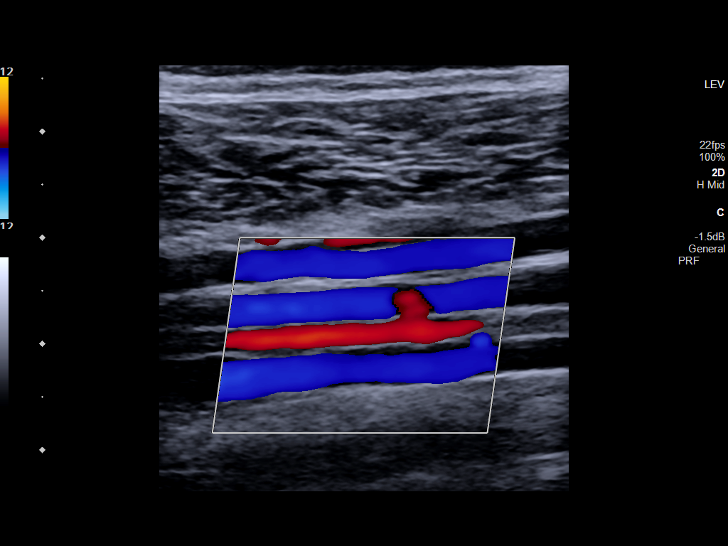
[im 27/27]
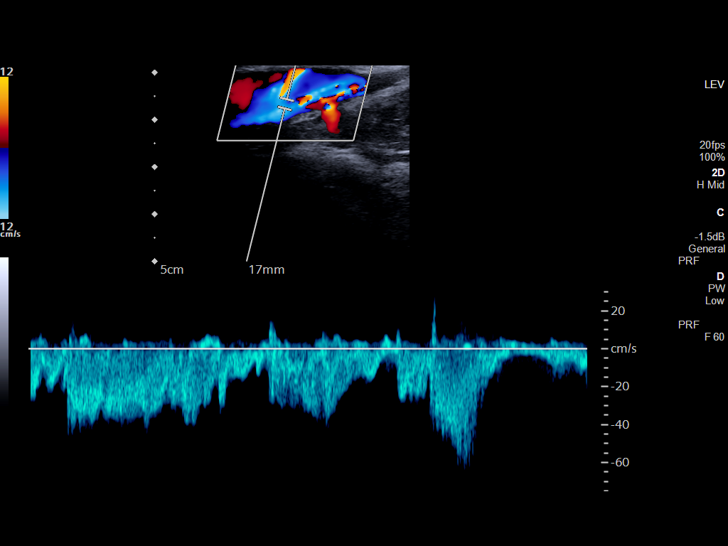

[14 of 24 positions shown; findings below may reference images not displayed]

FINDINGS: VENOUS

Normal compressibility of the common femoral, superficial femoral,
and popliteal veins, as well as the visualized calf veins.
Visualized portions of profunda femoral vein and great saphenous
vein unremarkable. No filling defects to suggest DVT on grayscale or
color Doppler imaging. Doppler waveforms show normal direction of
venous flow, normal respiratory plasticity and response to
augmentation.

Limited views of the contralateral common femoral vein are
unremarkable.

OTHER

None.

Limitations: none
IMPRESSION: No left lower extremity deep venous thrombosis.

## 2021-10-04 ENCOUNTER — Other Ambulatory Visit: Payer: Self-pay

## 2021-12-01 ENCOUNTER — Ambulatory Visit (INDEPENDENT_AMBULATORY_CARE_PROVIDER_SITE_OTHER): Payer: Medicaid Other | Admitting: Licensed Practical Nurse

## 2021-12-01 ENCOUNTER — Encounter: Payer: Self-pay | Admitting: Licensed Practical Nurse

## 2021-12-01 ENCOUNTER — Other Ambulatory Visit (HOSPITAL_COMMUNITY)
Admission: RE | Admit: 2021-12-01 | Discharge: 2021-12-01 | Disposition: A | Discharge status: Home / Self Care | Payer: Medicaid Other | Source: Ambulatory Visit | Attending: Licensed Practical Nurse | Admitting: Licensed Practical Nurse

## 2021-12-01 VITALS — BP 100/64 | Ht 69.0 in | Wt 117.0 lb

## 2021-12-01 DIAGNOSIS — R102 Pelvic and perineal pain: Secondary | ICD-10-CM | POA: Diagnosis present

## 2021-12-01 DIAGNOSIS — Z01419 Encounter for gynecological examination (general) (routine) without abnormal findings: Secondary | ICD-10-CM | POA: Diagnosis not present

## 2021-12-01 DIAGNOSIS — N898 Other specified noninflammatory disorders of vagina: Secondary | ICD-10-CM | POA: Insufficient documentation

## 2021-12-01 DIAGNOSIS — Z862 Personal history of diseases of the blood and blood-forming organs and certain disorders involving the immune mechanism: Secondary | ICD-10-CM

## 2021-12-01 DIAGNOSIS — N94819 Vulvodynia, unspecified: Secondary | ICD-10-CM

## 2021-12-01 DIAGNOSIS — Z113 Encounter for screening for infections with a predominantly sexual mode of transmission: Secondary | ICD-10-CM

## 2021-12-01 NOTE — Progress Notes (Signed)
? ? ? ?Gynecology Annual Exam  ?PCP: Patient, No Pcp Per (Inactive) ? ?Chief Complaint:  ?Chief Complaint  ?Patient presents with  ? Gynecologic Exam  ? ? ?History of Present Illness: Patient is a 26 y.o. [phone] presents for annual exam. The patient has had increased discharge, low sex drive, painful intercourse, depression.  ? ?LMP: Patient's last menstrual period was 11/06/2021 (exact date). ?Average Interval: regular, monthly ?Heavy Menses: no ?Clots: no ?Intermenstrual Bleeding: no ?Postcoital Bleeding: yes ?Dysmenorrhea: no ? ?The patient is sexually active. She currently uses tubal ligation for contraception. She has dyspareunia.  The patient does perform self breast exams.  There is no notable family history of breast or ovarian cancer in her family. ? ?The patient wears seatbelts: yes.  The patient has regular exercise:  is active at work  .   ? ?The patient  REPORTS  current symptoms of depression.   ?Currently in therapy and trying to improve herself. Is on medications, has not had a sex drive. She never initiates sex, does not get interested when he initiates but does not refuse him, she "just goes along with it" ?Lives with her partner and children, currently they are having "issues", he is unwilling to go to therapy, she denies any abuse,  ? ?Dyspareunia: has been happening for about a month or so, with every episode of IC, it is with entry and the entire time, she has cramping and vaginal burning afterward, the first void after IC also burns. She has always had vaginal discomfort, she is not able to wear tampons. 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  ? ?Discharge: she always has a lt of discharge but she is having  more than usual, denies any odor.  ? ?Review of Systems: Review of Systems  ?Constitutional: Negative.   ?Eyes:   ?     Difficult to read things far away   ?Respiratory: Negative.    ?Cardiovascular: Negative.   ?Gastrointestinal:  Negative for abdominal  pain.  ?Genitourinary:   ?     Pain during sex, vaginal discharge  ?Endo/Heme/Allergies:  Positive for environmental allergies. Bruises/bleeds easily.  ?Psychiatric/Behavioral:  Positive for depression. The patient is nervous/anxious.   ? ?Past Medical History:  ?Patient Active Problem List  ? Diagnosis Date Noted  ? Normal vaginal delivery 03/21/2020  ? Postpartum care following vaginal delivery 03/21/2020  ? Encounter for sterilization 03/20/2020  ? Encounter for elective induction of labor 03/19/2020  ? Susceptible to varicella (non-immune), currently pregnant 03/19/2020  ? Labor and delivery indication for care or intervention 03/11/2020  ? Braxton Hicks contractions   ? Pelvic pain in pregnancy, antepartum, third trimester 02/24/2020  ? Indication for care in labor and delivery, antepartum 02/10/2020  ? Vaginal bleeding in pregnancy, third trimester 02/04/2020  ? Abdominal trauma 11/05/2019  ? Supervision of high risk pregnancy, antepartum 08/21/2019  ?  Cl[facility] Prenatal Labs  ?Dating LMP Blood type: O/Positive/-- (01/15 1552)   ?Genetic Screen declines Antibody:Negative (01/15 1552)  ?Anatomic US Complete ?low lying placenta -resolved Rubella: 1.09 (01/15 1552) Varicella: Non immune  ?GTT Third trimester: 53 RPR: Non Reactive (01/15 1552)   ?Rhogam  not needed HBsAg: Negative (01/15 1552)   ?TDaP vaccine     01/22/20                  Flu Shot: HIV: Non Reactive (01/15 1552)   ?Baby Food    Bottle  GBS:   ?Contraception  Tubal ligation ?Consents signed 11/04/2019 Pap: ASCUS HPV+, 2020  ?CBB     ?CS/VBAC    ?Support Person    ? ? ?  ? History of preterm delivery, currently pregnant 08/21/2019  ? Maternal iron deficiency anemia affecting pregnancy in third trimester, antepartum 01/13/2018  ?  Formatting of this note might be different from the original. ?IV iron, B12 IM 02/04/18 ? ?  ? Low maternal serum vitamin B12 01/13/2018  ? Rubella non-immune status, antepartum 11/18/2017  ?  Frequent UTI 11/12/2017  ? Anxiety 11/12/2017  ? ? ?Past Surgical History:  ?Past Surgical History:  ?Procedure Laterality Date  ? NO PAST SURGERIES    ? TUBAL LIGATION Bilateral 03/20/2020  ? Procedure: POST PARTUM TUBAL LIGATION;  Surgeon: Conard Novak, MD;  Location: [facility] ORS;  Service: Gynecology;  Laterality: Bilateral;  ? ? ?Gynecologic History:  ?Patient's last menstrual period was 11/06/2021 (exact date). ?Contraception: tubal ligation ?Last Pap: Results were: 05/05/20 no abnormalities  ? ?Obstetric History: [id number] ? ?Family History:  ?Family History  ?Problem Relation Age of Onset  ? Breast cancer Neg Hx   ? Ovarian cancer Neg Hx   ? ? ?Social History:  ?Social History  ? ?Socioeconomic History  ? Marital status: Single  ?  Spouse name: Not on file  ? Number of children: Not on file  ? Years of education: Not on file  ? Highest education level: Not on file  ?Occupational History  ? Not on file  ?Tobacco Use  ? Smoking status: Never  ? Smokeless tobacco: Never  ?Vaping Use  ? Vaping Use: Never used  ?Substance and Sexual Activity  ? Alcohol use: Not Currently  ? Drug use: Not Currently  ? Sexual activity: Yes  ?  Birth control/protection: Surgical  ?  Comment: BTL  ?Other Topics Concern  ? Not on file  ?Social History Narrative  ? Not on file  ? ?Social Determinants of Health  ? ?Financial Resource Strain: Not on file  ?Food Insecurity: Not on file  ?Transportation Needs: Not on file  ?Physical Activity: Not on file  ?Stress: Not on file  ?Social Connections: Not on file  ?Intimate Partner Violence: Not on file  ? ? ?Allergies:  ?Allergies  ?Allergen Reactions  ? Sulfa Antibiotics Hives  ? Iodine   ? Other Hives  ?  Itching and small hives s/p iodine injection  ? ? ?Medications: ?Prior to Admission medications   ?Medication Sig Start Date End Date Taking? Authorizing Provider  ?busPIRone (BUSPAR) 5 MG tablet Increase dose, as before, from 10 mg twice daily to 20 mg bid.  Increase dose by 5 mg every 4  days. ?Patient not taking: Reported on 03/22/2021 05/31/20   Conard Novak, MD  ?famotidine (PEPCID) 20 MG tablet Take 1 tablet (20 mg total) by mouth 2 (two) times daily for 10 days. 07/16/20 07/26/20  Sharman Cheek, MD  ?ferrous sulfate 325 (65 FE) MG tablet Take 1 tablet (325 mg total) by mouth daily with breakfast. ?Patient not taking: Reported on 03/22/2021 03/21/20   Farrel Conners, CNM  ?naproxen (NAPROSYN) 500 MG tablet Take 1 tablet (500 mg total) by mouth 2 (two) times daily with a meal. ?Patient not taking: Reported on 03/22/2021 07/16/20   Sharman Cheek, MD  ?ondansetron (ZOFRAN ODT) 4 MG disintegrating tablet Take 1 tablet (4 mg total) by mouth every 8 (eight) hours as needed for nausea or vomiting. ?Patient not taking: Reported on 03/22/2021  07/16/20   Sharman Cheek, MD  ?predniSONE (DELTASONE) 20 MG tablet Take 2 tablets (40 mg total) by mouth daily. ?Patient not taking: Reported on 03/22/2021 07/17/20   Sharman Cheek, MD  ?sertraline (ZOLOFT) 50 MG tablet Take 0.5 tablet x 3 days then increase to 50 mgm daily. ?Patient not taking: Reported on 03/22/2021 03/21/20   Farrel Conners, CNM  ? ? ?Physical Exam ?Vitals: Blood pressure 100/64, height 5\' 9"  (1.753 m), weight 117 lb (53.1 kg), last menstrual period 11/06/2021, not currently breastfeeding. ? ?General: NAD ?HEENT: normocephalic, anicteric ?Thyroid: no enlargement, no palpable nodules ?Pulmonary: No increased work of breathing, CTAB ?Cardiovascular: RRR, distal pulses 2+ ?Breast: Breast symmetrical, no tenderness, no palpable nodules or masses, no skin or nipple retraction present, no nipple discharge.  No axillary or supraclavicular lymphadenopathy. ?Abdomen: NABS, soft, non-tender, non-distended.  Umbilicus without lesions.  No hepatomegaly, splenomegaly or masses palpable. No evidence of hernia  ?Genitourinary: ? External: Normal external female genitalia.  Normal urethral meatus, normal Bartholin's and Skene's glands.   Tender when being touch with cotton swab on both labia ? Vagina: Normal vaginal mucosa, no evidence of prolapse.  Able to introduce speculum into vagina and visualize cervix, but exam was very painful for the

## 2021-12-02 DIAGNOSIS — N94819 Vulvodynia, unspecified: Secondary | ICD-10-CM | POA: Insufficient documentation

## 2021-12-05 ENCOUNTER — Encounter: Payer: Self-pay | Admitting: Licensed Practical Nurse

## 2021-12-05 LAB — CERVICOVAGINAL ANCILLARY ONLY
Bacterial Vaginitis (gardnerella): NEGATIVE
Candida Glabrata: POSITIVE — AB
Candida Vaginitis: NEGATIVE
Chlamydia: NEGATIVE
Comment: NEGATIVE
Comment: NEGATIVE
Comment: NEGATIVE
Comment: NEGATIVE
Comment: NEGATIVE
Comment: NORMAL
Neisseria Gonorrhea: NEGATIVE
Trichomonas: NEGATIVE

## 2021-12-06 ENCOUNTER — Other Ambulatory Visit: Payer: Self-pay | Admitting: Licensed Practical Nurse

## 2021-12-06 DIAGNOSIS — B49 Unspecified mycosis: Secondary | ICD-10-CM

## 2021-12-06 MED ORDER — TERCONAZOLE 0.4 % VA CREA: 1.0000 | TOPICAL_CREAM | Freq: Every day | VAGINAL | 0 refills | Status: DC

## 2021-12-06 NOTE — Progress Notes (Signed)
Lab results shows candida glabrata, script for Terazol sent to pharmacy on file. Message sent through [place]. ?Roberto Scales, CNM  ?Mosetta Pigeon, [facility]  ?12/06/21  ?12:42 PM  ? ?

## 2021-12-20 ENCOUNTER — Ambulatory Visit: Payer: Medicaid Other | Admitting: Physical Therapy

## 2021-12-27 ENCOUNTER — Encounter: Payer: Self-pay | Admitting: Physical Therapy

## 2021-12-27 ENCOUNTER — Ambulatory Visit: Payer: Medicaid Other | Attending: Licensed Practical Nurse | Admitting: Physical Therapy

## 2021-12-27 DIAGNOSIS — R102 Pelvic and perineal pain: Secondary | ICD-10-CM | POA: Diagnosis present

## 2021-12-27 DIAGNOSIS — N94819 Vulvodynia, unspecified: Secondary | ICD-10-CM | POA: Diagnosis not present

## 2021-12-27 DIAGNOSIS — R278 Other lack of coordination: Secondary | ICD-10-CM | POA: Insufficient documentation
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
## 2022-01-03 ENCOUNTER — Ambulatory Visit: Payer: Medicaid Other | Admitting: Physical Therapy

## 2022-01-03 ENCOUNTER — Encounter: Payer: Self-pay | Admitting: Physical Therapy

## 2022-01-03 DIAGNOSIS — R102 Pelvic and perineal pain: Secondary | ICD-10-CM | POA: Diagnosis not present

## 2022-01-03 DIAGNOSIS — R278 Other lack of coordination: Secondary | ICD-10-CM
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
## 2022-01-10 ENCOUNTER — Encounter: Payer: Self-pay | Admitting: Physical Therapy

## 2022-01-17 ENCOUNTER — Ambulatory Visit: Payer: Medicaid Other | Attending: Licensed Practical Nurse | Admitting: Physical Therapy

## 2022-01-17 ENCOUNTER — Encounter: Payer: Self-pay | Admitting: Physical Therapy

## 2022-01-17 DIAGNOSIS — R278 Other lack of coordination: Secondary | ICD-10-CM | POA: Diagnosis present

## 2022-01-17 DIAGNOSIS — R102 Pelvic and perineal pain: Secondary | ICD-10-CM | POA: Diagnosis present

## 2022-01-17 NOTE — Therapy (Signed)
OUTPATIENT PHYSICAL THERAPY FEMALE PELVIC TREATMENT   Patient Name: Sara Mcneil MRN: [id number] DOB:[birth year], 26 y.o., female Today's Date: 01/17/2022   PT End of Session - 01/17/22 1301     Visit Number 3    Number of Visits 12    Date for PT Re-Evaluation 03/21/22    Authorization Type [employer]    Authorization Time Period evicore 3 visits    Authorization - Visit Number 3    Authorization - Number of Visits 3    PT Start Time 1030    PT Stop Time 1110    PT Time Calculation (min) 40 min    Activity Tolerance Patient tolerated treatment well    Behavior During Therapy [facility] for tasks assessed/performed              Past Medical History:  Diagnosis Date   HPV (human papilloma virus) infection    PID (acute pelvic inflammatory disease)    Postpartum care following vaginal delivery 02/18/2018   Preterm delivery    Past Surgical History:  Procedure Laterality Date   NO PAST SURGERIES     TUBAL LIGATION Bilateral 03/20/2020   Procedure: POST PARTUM TUBAL LIGATION;  Surgeon: Conard Novak, MD;  Location: [facility] ORS;  Service: Gynecology;  Laterality: Bilateral;   Patient Active Problem List   Diagnosis Date Noted   Vulvodynia, 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   Normal vaginal delivery 03/21/2020   Postpartum care following vaginal delivery 03/21/2020   Encounter for sterilization 03/20/2020   Encounter for elective induction of labor 03/19/2020   Susceptible to varicella (non-immune), currently pregnant 03/19/2020   Labor and delivery indication for care or intervention 03/11/2020   Braxton Hicks contractions    Pelvic pain in pregnancy, antepartum, third trimester 02/24/2020   Indication for care in labor and delivery, antepartum 02/10/2020   Vaginal bleeding in pregnancy, third trimester 02/04/2020   Abdominal trauma 11/05/2019   Supervision of high risk pregnancy, antepartum 08/21/2019   History of preterm delivery, currently pregnant 08/21/2019   Maternal  iron deficiency anemia affecting pregnancy in third trimester, antepartum 01/13/2018   Low maternal serum vitamin B12 01/13/2018   Rubella non-immune status, antepartum 11/18/2017   Frequent UTI 11/12/2017   Anxiety 11/12/2017    PCP: Patient, No Pcp Per (Inactive)  REFERRING PROVIDER: Ellwood Sayers, Salena Saner*  REFERRING DIAG: [phone] (ICD-10-CM) - Vulvodynia, unspecified   THERAPY DIAG:  Pelvic pain  Other lack of coordination  Rationale for Evaluation and Treatment Rehabilitation  PRECAUTIONS: None  WEIGHT BEARING RESTRICTIONS No  FALLS:  Has patient fallen in last 6 months? No  ONSET DATE: March-April 2023  SUBJECTIVE: Patient reports that she also noticed with recent menstrual cycle an increased heaviness in vulvar tissue. Patient does have improved ease of BM with menstrual cycle.    PERTINENT HISTORY/CHART REVIEW:  Red flags (bowel/bladder changes, saddle paresthesia, personal history of cancer, h/o spinal tumors, h/o compression fx, h/o abdominal aneurysm, abdominal pain, chills/fever, night sweats, nausea, vomiting, unrelenting pain, first onset of insidious LBP <20 y/o)  GYN note: "Dyspareunia: has been happening for about a month or so, with every episode of IC, it is with entry and the entire time, she has cramping and vaginal burning afterward, the first void after IC also burns. She has always had vaginal discomfort, she is not able to wear tampons. 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  Genitourinary:             External: Normal  external female genitalia.  Normal urethral meatus, normal Bartholin's and Skene's glands.  Tender when being touch with cotton swab on both labia             Vagina: Normal vaginal mucosa, no evidence of prolapse.  Able to introduce speculum into vagina and visualize cervix, but exam was very painful for the pt.              Cervix: Grossly normal in appearance, no bleeding             Uterus:  Non-enlarged, mobile, normal contour.  No CMT pt very uncomfortable during exam.              Adnexa: ovaries non-enlarged, no adnexal masses             Rectal: deferred             Lymphatic: no evidence of inguinal lymphadenopathy Referral for pelvic PT, the pelvic pain may have a mental component, consider cognitive behavioral therapy."     PAIN:  Are you having pain? 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   Therapeutic Exercise:   Treatments unbilled:  Post-treatment assessment:  Patient educated throughout session on appropriate technique and form using multi-modal cueing, HEP, and activity modification. Patient articulated understanding and returned demonstration.  Patient Response to interventions: Comfortable to try new stretches  PATIENT SURVEYS:  FOTO PFDI Pain 33, Bowel Constipation 38, Urinary 64  ASSESSMENT:  Clinical  Impression: Patient presents to clinic with excellent motivation to participate in therapy. Patient demonstrates deficits in PFM coordination and function, pain, posture, nervous system downregulation. Patient able to tolerate standing hip flexor stretches during today's session and responded positively to active and educational interventions. Patient will benefit from continued skilled therapeutic intervention to address remaining deficits in PFM coordination and function, pain, posture, nervous system downregulation in order to increase function and improve overall QOL.  Objective impairments: Abnormal gait, decreased coordination, decreased knowledge of condition, decreased strength, impaired tone, improper body mechanics, postural dysfunction, and pain.   Activity limitations: meal prep, cleaning, laundry, interpersonal relationship, and community activity.   Personal factors: Age, Past/current experiences, Time since onset of injury/illness/exacerbation, and 3+ comorbidities: abdominal trauma (history), anxiety, frequent UTI, PID  are also affecting patient's functional outcome.   Rehab Potential: Good  Clinical decision making: Evolving/moderate complexity  Evaluation complexity: Moderate   GOALS: Goals reviewed with patient? Yes  SHORT TERM GOALS: Target date: 02/28/2022  Patient will demonstrate independence with HEP in order to maximize therapeutic gains and improve carryover from physical therapy sessions to ADLs in the home and  community. Baseline: not initiated Goal status: INITIAL   LONG TERM GOALS: Target date: 04/11/2022  Patient will demonstrate improved function as evidenced by a score of <15 on PFDI Pain measure for full participation in activities at home and in the community.  Baseline: 33 Goal status: INITIAL  Patient will demonstrate improved function as evidenced by a score of >46 on FOTO Bowel Constipation measure for full participation in activities at home and  in the community.  Baseline: 38 Goal status: INITIAL  Patient will decrease worst pain as reported on NPRS by at least 2 points to demonstrate clinically significant reduction in pain in order to restore/improve function and overall QOL. Baseline: 10+/10 Goal status: INITIAL   PLAN: Rehab frequency: 1x/week  Rehab duration: 12 weeks  Planned interventions: Therapeutic exercises, Therapeutic activity, Neuromuscular re-education, Balance training, Gait training, Patient/Family education, Joint mobilization, Orthotic/Fit training, Spinal mobilization, Cryotherapy, Moist heat, Taping, and Manual therapy     Sheria LangKatlin Dorleen Kissel PT, DPT [phone]#[id number]  01/17/2022, 1:02 PM

## 2022-01-24 ENCOUNTER — Encounter: Payer: Self-pay | Admitting: Physical Therapy

## 2022-01-31 ENCOUNTER — Encounter: Payer: Self-pay | Admitting: Licensed Practical Nurse

## 2022-01-31 ENCOUNTER — Ambulatory Visit: Payer: Medicaid Other | Admitting: Physical Therapy

## 2022-02-14 ENCOUNTER — Encounter: Payer: Self-pay | Admitting: Physical Therapy

## 2022-02-14 ENCOUNTER — Ambulatory Visit: Payer: Medicaid Other | Attending: Licensed Practical Nurse | Admitting: Physical Therapy

## 2022-02-14 DIAGNOSIS — R278 Other lack of coordination: Secondary | ICD-10-CM | POA: Diagnosis present

## 2022-02-14 DIAGNOSIS — R102 Pelvic and perineal pain: Secondary | ICD-10-CM | POA: Insufficient documentation

## 2022-02-14 NOTE — Therapy (Signed)
OUTPATIENT PHYSICAL THERAPY FEMALE PELVIC TREATMENT   Patient Name: Sara Mcneil MRN: [id number] DOB:[birth year], 26 y.o., female Today's Date: 02/14/2022   PT End of Session - 02/14/22 1343     Visit Number 4    Number of Visits 12    Date for PT Re-Evaluation 03/21/22    Authorization Type [employer]    Authorization Time Period evicore 3 visits    Authorization - Visit Number 3    Authorization - Number of Visits 3    PT Start Time 1345    PT Stop Time 1425    PT Time Calculation (min) 40 min    Activity Tolerance Patient tolerated treatment well    Behavior During Therapy [facility] for tasks assessed/performed              Past Medical History:  Diagnosis Date   HPV (human papilloma virus) infection    PID (acute pelvic inflammatory disease)    Postpartum care following vaginal delivery 02/18/2018   Preterm delivery    Past Surgical History:  Procedure Laterality Date   NO PAST SURGERIES     TUBAL LIGATION Bilateral 03/20/2020   Procedure: POST PARTUM TUBAL LIGATION;  Surgeon: Will Bonnet, MD;  Location: [facility] ORS;  Service: Gynecology;  Laterality: Bilateral;   Patient Active Problem List   Diagnosis Date Noted   Vulvodynia, 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   Normal vaginal delivery 03/21/2020   Postpartum care following vaginal delivery 03/21/2020   Encounter for sterilization 03/20/2020   Encounter for elective induction of labor 03/19/2020   Susceptible to varicella (non-immune), currently pregnant 03/19/2020   Labor and delivery indication for care or intervention 03/11/2020   Braxton Hicks contractions    Pelvic pain in pregnancy, antepartum, third trimester 02/24/2020   Indication for care in labor and delivery, antepartum 02/10/2020   Vaginal bleeding in pregnancy, third trimester 02/04/2020   Abdominal trauma 11/05/2019   Supervision of high risk pregnancy, antepartum 08/21/2019   History of preterm delivery, currently pregnant 08/21/2019   Maternal  iron deficiency anemia affecting pregnancy in third trimester, antepartum 01/13/2018   Low maternal serum vitamin B12 01/13/2018   Rubella non-immune status, antepartum 11/18/2017   Frequent UTI 11/12/2017   Anxiety 11/12/2017    PCP: Patient, No Pcp Per  REFERRING PROVIDER: Dominic, [place]: [phone] (ICD-10-CM) - Vulvodynia, unspecified   THERAPY DIAG:  Pelvic pain  Other lack of coordination  Rationale for Evaluation and Treatment Rehabilitation  PRECAUTIONS: None  WEIGHT BEARING RESTRICTIONS No  FALLS:  Has patient fallen in last 6 months? No  ONSET DATE: March-April 2023  SUBJECTIVE: Patient returns to clinic after brief hiatus in care due to unexplained passage of substantial clots. Patient continues to deal with abdominal cramping two weeks out. Patient notes that she has most cramping on LUQ and LLQ. Patient has not had BM for 10+ days.    PERTINENT HISTORY/CHART REVIEW:  Red flags (bowel/bladder changes, saddle paresthesia, personal history of cancer, h/o spinal tumors, h/o compression fx, h/o abdominal aneurysm, abdominal pain, chills/fever, night sweats, nausea, vomiting, unrelenting pain, first onset of insidious LBP <20 y/o)  GYN note: "Dyspareunia: has been happening for about a month or so, with every episode of IC, it is with entry and the entire time, she has cramping and vaginal burning afterward, the first void after IC also burns. She has always had vaginal discomfort, she is not able to wear tampons. 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  Genitourinary:             External: Normal external female genitalia.  Normal urethral meatus, normal Bartholin's and Skene's glands.  Tender when being touch with cotton swab on both labia             Vagina: Normal vaginal mucosa, no evidence of prolapse.  Able to introduce speculum into vagina and visualize cervix, but exam was very painful for the pt.               Cervix: Grossly normal in appearance, no bleeding             Uterus: Non-enlarged, mobile, normal contour.  No CMT pt very uncomfortable during exam.              Adnexa: ovaries non-enlarged, no adnexal masses             Rectal: deferred             Lymphatic: no evidence of inguinal lymphadenopathy Referral for pelvic PT, the pelvic pain may have a mental component, consider cognitive behavioral therapy."     PAIN:  Are you having pain? 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 Discussed techniques for management of bowel dysfunction and abdominal discomfort; provided with handouts.   Therapeutic Exercise:   Treatments unbilled:  Post-treatment assessment:  Patient educated throughout session on appropriate technique and form using multi-modal cueing, HEP, and activity modification. Patient articulated understanding and returned demonstration.  Patient Response to interventions: Comfortable to try new stretches  PATIENT SURVEYS:  FOTO PFDI Pain 33, Bowel Constipation 38, Urinary 64  ASSESSMENT:  Clinical Impression: Patient presents to clinic to participate in therapy. Patient demonstrates continued deficits in PFM coordination and function, pain, posture, nervous system downregulation. Patient has not been  seen in clinic for ~ 3 weeks 2/2 to increased passing of blood clots and 2x ED visits for management of pelvic pain and constipation. Patient's pain has not been successfully managed to date by medical or physical intervention, but patient remains hopeful and continues to do HEP with modest improvement in some of her pelvic discomfort. On goal reassessment during today's session, patient is not indicating any significant improvement in symptoms, but has a precipitous increase in distress 2/2 to pelvic pain and a worsening of bowel symptoms since initiation of physical therapy intervention. Patient was encouraged to reach out to [facility] clinic for more specialized management of complex pelvic pain, and in the interim is amenable to continue participation in physical therapy if approved by insurance. Patient will likely benefit from continued skilled therapeutic intervention as well as medical management to address remaining deficits in PFM coordination and function, pain, posture, nervous system downregulation, and bowel dysfunction in order to increase function and improve overall QOL.  Objective impairments: Abnormal gait, decreased coordination, decreased knowledge of condition, decreased strength, impaired tone, improper body mechanics, postural dysfunction, and pain.  Activity limitations: meal prep, cleaning, laundry, interpersonal relationship, and community activity.   Personal factors: Age, Past/current experiences, Time since onset of injury/illness/exacerbation, and 3+ comorbidities: abdominal trauma (history), anxiety, frequent UTI, PID  are also affecting patient's functional outcome.   Rehab Potential: Good  Clinical decision making: Evolving/moderate complexity  Evaluation complexity: Moderate   GOALS: Goals reviewed with patient? Yes  SHORT TERM GOALS: Target date: 02/07/2022  Patient will demonstrate independence with HEP in order to maximize therapeutic gains and improve  carryover from physical therapy sessions to ADLs in the home and community. Baseline: not initiated; 7/12: IND Goal status: MET   LONG TERM GOALS: Target date: 03/21/2022  Patient will demonstrate improved function as evidenced by a score of <15 on PFDI Pain measure for full participation in activities at home and in the community.  Baseline: 33; 7/12: 63 Goal status: NOT MET  Patient will demonstrate improved function as evidenced by a score of >46 on FOTO Bowel Constipation measure for full participation in activities at home and in the community.  Baseline: 38; 7/12: 33 Goal status: NOT MET  Patient will decrease worst pain as reported on NPRS by at least 2 points to demonstrate clinically significant reduction in pain in order to restore/improve function and overall QOL. Baseline: 10+/10; 7/12: 9/10 Goal status: IN PROGRESS   PLAN: Rehab frequency: 1x/week  Rehab duration: 12 weeks  Planned interventions: Therapeutic exercises, Therapeutic activity, Neuromuscular re-education, Balance training, Gait training, Patient/Family education, Joint mobilization, Orthotic/Fit training, Spinal mobilization, Cryotherapy, Moist heat, Taping, and Manual therapy     Myles Gip PT, DPT [phone]  02/14/2022, 1:47 PM

## 2022-03-30 ENCOUNTER — Emergency Department: Payer: Medicaid Other

## 2022-03-30 ENCOUNTER — Encounter: Payer: Self-pay | Admitting: Emergency Medicine

## 2022-03-30 ENCOUNTER — Other Ambulatory Visit: Payer: Self-pay

## 2022-03-30 DIAGNOSIS — R109 Unspecified abdominal pain: Secondary | ICD-10-CM | POA: Insufficient documentation

## 2022-03-30 DIAGNOSIS — R0789 Other chest pain: Secondary | ICD-10-CM | POA: Insufficient documentation

## 2022-03-30 DIAGNOSIS — R55 Syncope and collapse: Secondary | ICD-10-CM | POA: Diagnosis not present

## 2022-03-30 DIAGNOSIS — R040 Epistaxis: Secondary | ICD-10-CM | POA: Diagnosis not present

## 2022-03-30 DIAGNOSIS — Z5321 Procedure and treatment not carried out due to patient leaving prior to being seen by health care provider: Secondary | ICD-10-CM | POA: Insufficient documentation

## 2022-03-30 DIAGNOSIS — R059 Cough, unspecified: Secondary | ICD-10-CM | POA: Insufficient documentation

## 2022-03-30 LAB — COMPREHENSIVE METABOLIC PANEL
ALT: 14 U/L (ref 0–44)
AST: 15 U/L (ref 15–41)
Albumin: 3.9 g/dL (ref 3.5–5.0)
Alkaline Phosphatase: 50 U/L (ref 38–126)
Anion gap: 3 — ABNORMAL LOW (ref 5–15)
BUN: 12 mg/dL (ref 6–20)
CO2: 26 mmol/L (ref 22–32)
Calcium: 8.9 mg/dL (ref 8.9–10.3)
Chloride: 109 mmol/L (ref 98–111)
Creatinine, Ser: 0.69 mg/dL (ref 0.44–1.00)
GFR, Estimated: >60 mL/min (ref >60)
Glucose, Bld: 97 mg/dL (ref 70–99)
Potassium: 3.7 mmol/L (ref 3.5–5.1)
Sodium: 138 mmol/L (ref 135–145)
Total Bilirubin: 0.7 mg/dL (ref 0.3–1.2)
Total Protein: 6.3 g/dL — ABNORMAL LOW (ref 6.5–8.1)

## 2022-03-30 LAB — CBC
HCT: 34.4 % — ABNORMAL LOW (ref 36.0–46.0)
Hemoglobin: 11.5 g/dL — ABNORMAL LOW (ref 12.0–15.0)
MCH: 31 pg (ref 26.0–34.0)
MCHC: 33.4 g/dL (ref 30.0–36.0)
MCV: 92.7 fL (ref 80.0–100.0)
Platelets: 257 10*3/uL (ref 150–400)
RBC: 3.71 MIL/uL — ABNORMAL LOW (ref 3.87–5.11)
RDW: 12.2 % (ref 11.5–15.5)
WBC: 5.4 10*3/uL (ref 4.0–10.5)
nRBC: 0 % (ref 0.0–0.2)

## 2022-03-30 LAB — POC URINE PREG, ED: Preg Test, Ur: NEGATIVE

## 2022-03-30 LAB — TROPONIN I (HIGH SENSITIVITY): Troponin I (High Sensitivity): <2 ng/L (ref <18)

## 2022-03-30 LAB — LIPASE, BLOOD: Lipase: 51 U/L (ref 11–51)

## 2022-03-30 NOTE — ED Triage Notes (Signed)
Pt to ED via POV with c/o L sided CP and, L flank pain, and "weird pain in belly button". Pt also c/o intermittent nosebleed. Pt states syncopal episode earlier today after standing up from her cough. Pt A&O x4, ambulatory without difficulty. Pt states recently started Prozac and Trazadone, states started those meds 2 days ago, states other symptoms have been ongoing x several weeks, and CP has been intermittent and recurrent x 15 yrs.

## 2022-03-30 NOTE — ED Notes (Signed)
Blood and urine collected.

## 2022-03-31 ENCOUNTER — Emergency Department
Admission: EM | Admit: 2022-03-31 | Discharge: 2022-03-31 | Discharge status: Against Medical Advice | Payer: Medicaid Other | Attending: Emergency Medicine | Admitting: Emergency Medicine

## 2022-03-31 NOTE — ED Notes (Signed)
Pt states she is leaving. Pt ambulatory in no acute distress.

## 2022-12-07 ENCOUNTER — Encounter: Payer: Self-pay | Admitting: Licensed Practical Nurse

## 2022-12-07 ENCOUNTER — Ambulatory Visit (INDEPENDENT_AMBULATORY_CARE_PROVIDER_SITE_OTHER): Payer: Medicaid Other | Admitting: Licensed Practical Nurse

## 2022-12-07 VITALS — BP 107/68 | HR 73 | Ht 69.0 in | Wt 116.8 lb

## 2022-12-07 DIAGNOSIS — N6452 Nipple discharge: Secondary | ICD-10-CM

## 2022-12-07 DIAGNOSIS — N644 Mastodynia: Secondary | ICD-10-CM

## 2022-12-07 NOTE — Progress Notes (Signed)
SUBJECTIVE: Here with partner for evaluation for Breat pain and Nipple discharge  Sara Mcneil has noticed pain in both breasts for 3 weeks, the pain is on the sides of her breasts, it hurts the most when they touched or bumped. Her nipples are also sensitive, she has seen a white discharge form her right breast. She has not felt any lumps in her breasts nor has che noticed any changes to the texture of her skin. Her youngest child is 27 years old, she is did not breastfeed for long. She wears a t-shirt style bra without under wires. Denies any recent injuries to her breasts. She drink 1 cup of coffee a day. She was recently started on Lamictal (about 1 week) and has been on Seroquel for sometime. She is not any form of hormones. LMP 11/08/22, She does not feel that is cyclic breast pain as this pain has been present daily for 3 weeks. Denies family history of breast cancer.   OBJECTIVE: BP 107/68   Ht 5\' 9"  (1.753 m)   Wt 116 lb 12.8 oz (53 kg)   LMP 11/08/2022 (Approximate)   BMI 17.25 kg/m   Gen: NAD Breasts: Right: no masses or redness, tenderness with palpation over upper outer quadrant, some fibrocystic changes present. Skin texture smooth.  Nipple intact normal in appearance, no discharge present.                Left: no masses or redness, tenderness with palpation over upper outer quadrant, some fibrocystic changes present. Skin texture smooth.  Nipple intact normal in appearance, no discharge present.   ASSESSMENT: Bilateral breast pain  Right nipple discharge  PLAN: Prolactin collected Bilateral Breast US ordered  Discussed we cannot always find the source for pain, you may consider trying a new bra and/or decreasing caffeine intact although 1 standard cup of coffee is probably not the cause of the pain.    Carie Caddy, CNM  [facility]  12/07/22  12:44 PM

## 2022-12-08 LAB — PROLACTIN: Prolactin: 6.5 ng/mL (ref 4.8–33.4)

## 2022-12-20 ENCOUNTER — Ambulatory Visit: Payer: Medicaid Other | Admitting: Licensed Practical Nurse

## 2022-12-25 ENCOUNTER — Ambulatory Visit
Admission: RE | Admit: 2022-12-25 | Discharge: 2022-12-25 | Disposition: A | Discharge status: Home / Self Care | Payer: Medicaid Other | Source: Ambulatory Visit | Attending: Licensed Practical Nurse | Admitting: Licensed Practical Nurse

## 2022-12-25 DIAGNOSIS — N644 Mastodynia: Secondary | ICD-10-CM

## 2023-01-24 ENCOUNTER — Other Ambulatory Visit (HOSPITAL_COMMUNITY)
Admission: RE | Admit: 2023-01-24 | Discharge: 2023-01-24 | Disposition: A | Discharge status: Home / Self Care | Payer: Medicaid Other | Source: Ambulatory Visit | Attending: Licensed Practical Nurse | Admitting: Licensed Practical Nurse

## 2023-01-24 ENCOUNTER — Ambulatory Visit (INDEPENDENT_AMBULATORY_CARE_PROVIDER_SITE_OTHER): Payer: Medicaid Other | Admitting: Licensed Practical Nurse

## 2023-01-24 ENCOUNTER — Encounter: Payer: Self-pay | Admitting: Licensed Practical Nurse

## 2023-01-24 VITALS — BP 103/64 | HR 77 | Ht 69.0 in | Wt 119.4 lb

## 2023-01-24 DIAGNOSIS — Z01419 Encounter for gynecological examination (general) (routine) without abnormal findings: Secondary | ICD-10-CM

## 2023-01-24 DIAGNOSIS — Z124 Encounter for screening for malignant neoplasm of cervix: Secondary | ICD-10-CM

## 2023-01-24 DIAGNOSIS — N9089 Other specified noninflammatory disorders of vulva and perineum: Secondary | ICD-10-CM

## 2023-01-24 DIAGNOSIS — Z113 Encounter for screening for infections with a predominantly sexual mode of transmission: Secondary | ICD-10-CM

## 2023-01-24 NOTE — Progress Notes (Signed)
Gynecology Annual Exam   PCP: Patient, No Pcp Per  Chief Complaint:  Chief Complaint  Patient presents with   Gynecologic Exam    History of Present Illness: Patient is a 27 y.o. [id number] presents for annual exam. The patient has no complaints today.   LMP: Patient's last menstrual period was 01/08/2023 (exact date). Average Interval: regular, monthly Duration of flow: 2wks Heavy Menses: in the beginning  Clots: no Intermenstrual Bleeding: no Postcoital Bleeding: no Dysmenorrhea: no  The patient is sexually active.with 1 female She currently uses tubal ligation for contraception. She has dyspareunia-currently being treated by a provider.  The patient does perform self breast exams.  There is no notable family history of breast or ovarian cancer in her family.  The patient wears seatbelts: yes.   The patient has regular exercise: yes.  Walks 6-7 miles a day at work  The patient reports current symptoms of depression.  Currently on medication and sees a therapist once a week. Doing well currently.  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 Gets "spells" where she needs to hold her breath to release the pressure in her chest. 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 Contraception: tubal ligation Last Pap: Results were: NIL and HR HPV negative 04/2020   Obstetric History: [id number]  Family History:  Family History  Problem Relation Age of Onset   Breast cancer Neg Hx    Ovarian cancer Neg Hx     Social History:  Social History   Socioeconomic History   Marital status: Single    Spouse name: Not on file   Number of children: Not on file   Years of education: Not on file   Highest education level: Not on file  Occupational History   Not on file  Tobacco Use   Smoking status: Never   Smokeless tobacco: Never  Vaping Use   Vaping Use: Never used  Substance and Sexual Activity   Alcohol use: Not Currently   Drug use: Not Currently   Sexual activity: Yes    Birth control/protection: Surgical    Comment: BTL  Other Topics Concern   Not on file  Social History Narrative   Not on file   Social Determinants of Health   Financial Resource Strain: Not on file  Food Insecurity: Not on file  Transportation Needs: Not on file  Physical Activity: Not on file  Stress: Not on file  Social Connections: Not on file  Intimate Partner Violence: Not on file    Allergies:  Allergies  Allergen Reactions   Iodinated Contrast  Media Hives   Sulfa Antibiotics Hives   Iodine    Other Hives    Itching and small hives s/p iodine injection    Medications: Prior to Admission medications   Medication Sig Start Date End Date Taking? Authorizing Provider  fluconazole (DIFLUCAN) 50 MG tablet Take 50 mg by mouth daily.   Yes [provider]  gabapentin (NEURONTIN) 300 MG capsule Take 300 mg by mouth 3 (three) times daily.   Yes [provider]  lamoTRIgine (LAMICTAL) 25 MG tablet Take 25 mg by mouth daily.   Yes [provider]   QUEtiapine (SEROQUEL XR) 200 MG 24 hr tablet Take 25 mg by mouth daily.   Yes [provider]  busPIRone (BUSPAR) 5 MG tablet Increase dose, as before, from 10 mg twice daily to 20 mg bid.  Increase dose by 5 mg every 4 days. Patient not taking: Reported on 03/22/2021 05/31/20   Conard Novak, MD  ondansetron (ZOFRAN ODT) 4 MG disintegrating tablet Take 1 tablet (4 mg total) by mouth every 8 (eight) hours as needed for nausea or vomiting. Patient not taking: Reported on 03/22/2021 07/16/20   Sharman Cheek, MD  sertraline (ZOLOFT) 50 MG tablet Take 0.5 tablet x 3 days then increase to 50 mgm daily. Patient not taking: Reported on 03/22/2021 03/21/20   Farrel Conners, CNM    Physical Exam Vitals: Blood pressure 103/64, pulse 77, height 5\' 9"  (1.753 m), weight 119 lb 6.4 oz (54.2 kg), last menstrual period 01/08/2023.  General: NAD HEENT: normocephalic, anicteric Thyroid: no enlargement, no palpable nodules Pulmonary: No increased work of breathing, CTAB Cardiovascular: RRR, distal pulses 2+ Breast: Breast symmetrical, no tenderness, no palpable nodules or masses, no skin or nipple retraction present, no nipple discharge.  No axillary or supraclavicular lymphadenopathy. Abdomen: NABS, soft, non-tender, non-distended.  Umbilicus without lesions.  No hepatomegaly, splenomegaly or masses palpable. No evidence of hernia  Genitourinary:  External: Normal external female genitalia.  Normal urethral meatus, normal Bartholin's and Skene's glands.  Small pinpoint raised are yellow in color on right, lower inner labia, tender to touch-HSV culture collected  Vagina: Normal vaginal mucosa, no evidence of prolapse.  Good tone   Cervix: Grossly normal in appearance, no bleeding  Uterus: Non-enlarged, mobile, normal contour.  No CMT  Adnexa: ovaries non-enlarged, no adnexal masses  Rectal: deferred  Lymphatic: no evidence of inguinal lymphadenopathy Extremities: no edema, erythema, or  tenderness Neurologic: Grossly intact Psychiatric: mood appropriate, affect full  Assessment: 27 y.o. [id number] routine annual exam  Plan: Problem List Items Addressed This Visit   None Visit Diagnoses     Labial lesion    -  Primary   Relevant Orders   HSV NAA   Encounter for annual routine gynecological examination       Well woman exam       Relevant Orders   Cytology - PAP   HEP, RPR, HIV Panel   Hepatitis C antibody   Cervical cancer screening       Relevant Orders   Cytology - PAP   Screening examination for venereal disease       Relevant Orders   HEP, RPR, HIV Panel   Hepatitis C antibody       2) STI screening  wasoffered and accepted  2)  ASCCP guidelines and rational discussed.  Patient opts for every 3 years screening interval  3) Contraception - the patient is currently using  tubal ligation.  She is happy with her current  form of contraception and plans to continue  4) Routine healthcare maintenance including cholesterol, diabetes screening discussed managed by PCP  5) No follow-ups on file.  Carie Caddy, CNM  [facility], [facility] 01/24/2023, 4:55 PM

## 2023-01-25 LAB — HEP, RPR, HIV PANEL
HIV Screen 4th Generation wRfx: NONREACTIVE
Hepatitis B Surface Ag: NEGATIVE
RPR Ser Ql: NONREACTIVE

## 2023-01-25 LAB — HEPATITIS C ANTIBODY: Hep C Virus Ab: NONREACTIVE

## 2023-01-28 LAB — CYTOLOGY - PAP
Chlamydia: NEGATIVE
Comment: NEGATIVE
Comment: NORMAL
Diagnosis: NEGATIVE
Neisseria Gonorrhea: NEGATIVE

## 2023-01-29 LAB — HSV NAA
HSV 1 NAA: NEGATIVE
HSV 2 NAA: NEGATIVE

## 2023-03-11 ENCOUNTER — Encounter: Payer: Self-pay | Admitting: Licensed Practical Nurse

## 2023-12-05 ENCOUNTER — Ambulatory Visit (HOSPITAL_BASED_OUTPATIENT_CLINIC_OR_DEPARTMENT_OTHER): Payer: MEDICAID | Admitting: Student

## 2023-12-05 ENCOUNTER — Encounter (HOSPITAL_BASED_OUTPATIENT_CLINIC_OR_DEPARTMENT_OTHER): Payer: Self-pay | Admitting: Student

## 2023-12-05 VITALS — BP 105/64 | HR 90 | Temp 98.7°F | Resp 16 | Ht 68.11 in | Wt 121.8 lb

## 2023-12-05 DIAGNOSIS — M25511 Pain in right shoulder: Secondary | ICD-10-CM | POA: Insufficient documentation

## 2023-12-05 DIAGNOSIS — M25512 Pain in left shoulder: Secondary | ICD-10-CM

## 2023-12-05 DIAGNOSIS — R634 Abnormal weight loss: Secondary | ICD-10-CM

## 2023-12-05 DIAGNOSIS — R21 Rash and other nonspecific skin eruption: Secondary | ICD-10-CM | POA: Diagnosis not present

## 2023-12-05 DIAGNOSIS — Z7689 Persons encountering health services in other specified circumstances: Secondary | ICD-10-CM

## 2023-12-05 MED ORDER — PREGABALIN 25 MG PO CAPS: 25.0000 mg | ORAL_CAPSULE | Freq: Two times a day (BID) | ORAL | 2 refills | Status: DC

## 2023-12-05 NOTE — Assessment & Plan Note (Signed)
 Reports joint pain in shoulders, hips, legs, and feet. No pain on joint examination. Possible chronic pain syndrome, polymyalgia rheumatica, or fibromyalgia considered.

## 2023-12-05 NOTE — Progress Notes (Signed)
 New Patient Office Visit  Subjective    Patient ID: Sara Mcneil, female    DOB: 1996-02-04  Age: 28 y.o. MRN: [id number]  CC:  Chief Complaint  Patient presents with   Establish Care    Here to establish care.    autoimmune disease    Would like to be screening for autoimmune disease. Has a disc that is damaged. Has had vascular, circulation, and muscle testing. Doing PT. Bruising easily. PT is concerned that she has a blood clot on right calf due to shifting when she flexes muscles.     Discussed the use of AI scribe software for clinical note transcription with the patient, who gave verbal consent to proceed.  History of Present Illness   Sara Mcneil is a 28 year old female who presents to establish care and discuss autoimmune concerns. She is accompanied by her son, Verdie Gladden.  She has not had a primary care provider for at least six years, focusing primarily on her autistic son. She had a well-woman exam at the end of last year due to ongoing gynecological issues.  She has a history of a slipped disc in her L5, identified after experiencing burning pain on the top of her foot and numbness in her left leg for years. She has undergone circulation, muscle, and vascular testing, all of which returned normal results. Recently, she underwent an EMG at [facility], which also returned normal. Despite these results, she experiences color changes and tenderness in her legs, particularly the right leg. She experiences pain starting at the bottom of her leg and moving to the middle, with tenderness upon touch and pain during upward foot movement. No family history of clotting issues.  She describes 'chest spells' where she cannot breathe in and holds her breath for about two minutes, a condition she has experienced since childhood. No shortness of breath, numbness, or tingling in her fingertips or lips.  She reports hair loss and changes in leg color, sometimes turning purple or patchy  red when exposed to grass or heat. She experiences rashes in three consistent locations: her left arm and both hips, which are extremely itchy and resemble ringworm. These rashes last a few days. Her face also becomes red, particularly in the sun, but this redness is not itchy and lasts a couple of hours. She has not been diagnosed with lupus.  She reports joint pain in her shoulders, hips, legs, and feet, describing a constant tightness that she cannot alleviate. She has a history of using gabapentin, which she discontinued due to excessive drowsiness. She has not found any medication that effectively manages her anxiety and depression, which she describes as not severe but persistent. She previously used Lamictal, Seroquel, and Zoloft , prescribed by a therapist she no longer sees.  She experiences headaches that vary in location, sometimes in the center of her head and other times at the back, often related to stress or situational factors. She also reports tenderness in her head area.  She has difficulty gaining weight, maintaining a weight of no more than 125 pounds throughout her life, except during pregnancy.     She notes occasional patches along flexural elbow and bilateral hips. States that they appear as "ringworm". Lasting only a couple of hours on her face.   Screenings:  Colon Cancer: no fmh Lung Cancer: none Breast Cancer: no fmh Diabetes: indicated HLD: indicated  Outpatient Encounter Medications as of 12/05/2023  Medication Sig   pregabalin  (LYRICA ) 25 MG  capsule Take 1 capsule (25 mg total) by mouth 2 (two) times daily.   [DISCONTINUED] fluconazole (DIFLUCAN) 50 MG tablet Take 50 mg by mouth daily.   [DISCONTINUED] gabapentin (NEURONTIN) 300 MG capsule Take 300 mg by mouth 3 (three) times daily.   [DISCONTINUED] lamoTRIgine (LAMICTAL) 25 MG tablet Take 25 mg by mouth daily.   [DISCONTINUED] ondansetron  (ZOFRAN  ODT) 4 MG disintegrating tablet Take 1 tablet (4 mg total) by mouth  every 8 (eight) hours as needed for nausea or vomiting. (Patient not taking: Reported on 03/22/2021)   [DISCONTINUED] QUEtiapine (SEROQUEL XR) 200 MG 24 hr tablet Take 25 mg by mouth daily.   [DISCONTINUED] sertraline  (ZOLOFT ) 50 MG tablet Take 0.5 tablet x 3 days then increase to 50 mgm daily. (Patient not taking: Reported on 03/22/2021)   No facility-administered encounter medications on file as of 12/05/2023.    Past Medical History:  Diagnosis Date   Anemia    Bipolar 2 disorder (HCC)    Borderline personality disorder (HCC)    Chronic UTI    HPV (human papilloma virus) infection    Kidney stones    PID (acute pelvic inflammatory disease)    Postpartum care following vaginal delivery 02/18/2018   Preterm delivery    Vulvodynia    Yeast infection     Past Surgical History:  Procedure Laterality Date   TUBAL LIGATION Bilateral 03/20/2020   Procedure: POST PARTUM TUBAL LIGATION;  Surgeon: Kris Pester, MD;  Location: [facility] OR[facility];  Service: Gynecology;  Laterality: Bilateral;    Family History  Problem Relation Age of Onset   Breast cancer Neg Hx    Ovarian cancer Neg Hx     Social History   Socioeconomic History   Marital status: Single    Spouse name: Not on file   Number of children: 3   Years of education: Not on file   Highest education level: Not on file  Occupational History   Not on file  Tobacco Use   Smoking status: Never    Passive exposure: Never   Smokeless tobacco: Never  Vaping Use   Vaping status: Never Used  Substance and Sexual Activity   Alcohol use: Yes    Comment: SOCIALLY   Drug use: Not Currently   Sexual activity: Yes    Birth control/protection: Surgical    Comment: BTL  Other Topics Concern   Not on file  Social History Narrative   2 children were premies    Social Drivers of Health   Financial Resource Strain: Not on file  Food Insecurity: No Food Insecurity (12/05/2023)   Hunger Vital Sign    Worried About Running Out of Food  in the Last Year: Never true    Ran Out of Food in the Last Year: Never true  Transportation Needs: No Transportation Needs (12/05/2023)   PRAPARE - Administrator, Civil Service (Medical): No    Lack of Transportation (Non-Medical): No  Physical Activity: Not on file  Stress: Not on file  Social Connections: Not on file  Intimate Partner Violence: Not At Risk (12/05/2023)   Humiliation, Afraid, Rape, and Kick questionnaire    Fear of Current or Ex-Partner: No    Emotionally Abused: No    Physically Abused: No    Sexually Abused: No    ROS  Per HPI      Objective    BP 105/64   Pulse 90   Temp 98.7 F (37.1 C) (Oral)   Resp 16  Ht 5' 8.11" (1.73 m)   Wt 121 lb 12.8 oz (55.2 kg)   LMP 11/15/2023   SpO2 98%   BMI 18.46 kg/m   Physical Exam Constitutional:      General: She is not in acute distress.    Appearance: Normal appearance. She is not ill-appearing.  HENT:     Head: Normocephalic and atraumatic.     Right Ear: External ear normal.     Left Ear: External ear normal.     Nose: Nose normal.     Mouth/Throat:     Mouth: Mucous membranes are moist.     Pharynx: Oropharynx is clear.  Eyes:     General: No scleral icterus.    Extraocular Movements: Extraocular movements intact.     Conjunctiva/sclera: Conjunctivae normal.     Pupils: Pupils are equal, round, and reactive to light.  Neck:     Vascular: No carotid bruit.  Cardiovascular:     Rate and Rhythm: Normal rate and regular rhythm.     Pulses: Normal pulses.     Heart sounds: Normal heart sounds. No murmur heard.    No friction rub.  Pulmonary:     Effort: Pulmonary effort is normal. No respiratory distress.     Breath sounds: Normal breath sounds. No wheezing, rhonchi or rales.     Comments: No noted pectus carinatum or excavatum Musculoskeletal:        General: Normal range of motion.     Cervical back: Neck supple.     Right lower leg: No edema.     Left lower leg: No edema.      Comments: Negative steinberg sign  Skin:    General: Skin is warm and dry.     Coloration: Skin is not jaundiced or pale.  Neurological:     General: No focal deficit present.     Mental Status: She is alert.     Sensory: Sensory deficit present.     Deep Tendon Reflexes: Reflexes normal.  Psychiatric:        Mood and Affect: Mood normal.        Behavior: Behavior normal.         Assessment & Plan:   Encounter to establish care  Facial rash Assessment & Plan: Concerns about possible autoimmune conditions due to symptoms such as joint pain, rashes, and hair loss. ANA test ordered to evaluate for lupus. Inflammatory markers to be checked. - Order sed rate and CRP to assess inflammation.  Orders: -     ANA w/Reflex -     C-reactive protein -     Sedimentation rate -     CBC with Differential/Platelet -     Comprehensive metabolic panel with GFR  Weight loss Assessment & Plan: Reports difficulty gaining weight, maintaining a weight of no more than 125 lbs throughout life. No specific cause identified. Thyroid function to be evaluated. - Order TSH to check thyroid function.   Orders: -     TSH -     CBC with Differential/Platelet -     Comprehensive metabolic panel with GFR  Pain of both shoulder joints Assessment & Plan: Reports joint pain in shoulders, hips, legs, and feet. No pain on joint examination. Possible chronic pain syndrome, polymyalgia rheumatica, or fibromyalgia considered.  Orders: -     Pregabalin ; Take 1 capsule (25 mg total) by mouth 2 (two) times daily.  Dispense: 60 capsule; Refill: 2 -     CBC with Differential/Platelet -  Comprehensive metabolic panel with GFR  Return in about 3 months (around 03/06/2024) for Chronic Followup, Annual Physical.   Laron Plummer Eugena Rhue, PA-C

## 2023-12-05 NOTE — Assessment & Plan Note (Signed)
 Reports difficulty gaining weight, maintaining a weight of no more than 125 lbs throughout life. No specific cause identified. Thyroid function to be evaluated. - Order TSH to check thyroid function.

## 2023-12-05 NOTE — Assessment & Plan Note (Signed)
 Concerns about possible autoimmune conditions due to symptoms such as joint pain, rashes, and hair loss. ANA test ordered to evaluate for lupus. Inflammatory markers to be checked. - Order sed rate and CRP to assess inflammation.

## 2023-12-05 NOTE — Patient Instructions (Signed)
 It was nice to see you today!  As we discussed in clinic:  I will let you know about your labs.  If you have any problems before your next visit feel free to message me via MyChart (minor issues or questions) or call the office, otherwise you may reach out to schedule an office visit.  Thank you! Quantavious Eggert, PA-C

## 2023-12-06 ENCOUNTER — Encounter (HOSPITAL_BASED_OUTPATIENT_CLINIC_OR_DEPARTMENT_OTHER): Payer: Self-pay | Admitting: Student

## 2023-12-06 LAB — CBC WITH DIFFERENTIAL/PLATELET
Basophils Absolute: 0 10*3/uL (ref 0.0–0.2)
Basos: 1 %
EOS (ABSOLUTE): 0.1 10*3/uL (ref 0.0–0.4)
Eos: 2 %
Hematocrit: 36.6 % (ref 34.0–46.6)
Hemoglobin: 12.3 g/dL (ref 11.1–15.9)
Immature Grans (Abs): 0 10*3/uL (ref 0.0–0.1)
Immature Granulocytes: 0 %
Lymphocytes Absolute: 1.6 10*3/uL (ref 0.7–3.1)
Lymphs: 24 %
MCH: 30.9 pg (ref 26.6–33.0)
MCHC: 33.6 g/dL (ref 31.5–35.7)
MCV: 92 fL (ref 79–97)
Monocytes Absolute: 0.5 10*3/uL (ref 0.1–0.9)
Monocytes: 7 %
Neutrophils Absolute: 4.4 10*3/uL (ref 1.4–7.0)
Neutrophils: 66 %
Platelets: 566 10*3/uL — ABNORMAL HIGH (ref 150–450)
RBC: 3.98 x10E6/uL (ref 3.77–5.28)
RDW: 12.2 % (ref 11.7–15.4)
WBC: 6.6 10*3/uL (ref 3.4–10.8)

## 2023-12-06 LAB — COMPREHENSIVE METABOLIC PANEL WITH GFR
ALT: 17 IU/L (ref 0–32)
AST: 21 IU/L (ref 0–40)
Albumin: 4.3 g/dL (ref 4.0–5.0)
Alkaline Phosphatase: 75 IU/L (ref 44–121)
BUN/Creatinine Ratio: 17 (ref 9–23)
BUN: 12 mg/dL (ref 6–20)
Bilirubin Total: 0.3 mg/dL (ref 0.0–1.2)
CO2: 21 mmol/L (ref 20–29)
Calcium: 9.6 mg/dL (ref 8.7–10.2)
Chloride: 104 mmol/L (ref 96–106)
Creatinine, Ser: 0.71 mg/dL (ref 0.57–1.00)
Globulin, Total: 2.4 g/dL (ref 1.5–4.5)
Glucose: 116 mg/dL — ABNORMAL HIGH (ref 70–99)
Potassium: 3.8 mmol/L (ref 3.5–5.2)
Sodium: 138 mmol/L (ref 134–144)
Total Protein: 6.7 g/dL (ref 6.0–8.5)
eGFR: 119 mL/min/{1.73_m2} (ref >59)

## 2023-12-06 LAB — TSH: TSH: 0.793 u[IU]/mL (ref 0.450–4.500)

## 2023-12-06 LAB — C-REACTIVE PROTEIN: CRP: <1 mg/L (ref 0–10)

## 2023-12-06 LAB — ANA W/REFLEX: Anti Nuclear Antibody (ANA): NEGATIVE

## 2023-12-06 LAB — SEDIMENTATION RATE: Sed Rate: 2 mm/h (ref 0–32)

## 2024-03-05 ENCOUNTER — Ambulatory Visit (HOSPITAL_BASED_OUTPATIENT_CLINIC_OR_DEPARTMENT_OTHER): Payer: MEDICAID | Admitting: Student

## 2024-03-05 ENCOUNTER — Encounter (HOSPITAL_BASED_OUTPATIENT_CLINIC_OR_DEPARTMENT_OTHER): Payer: Self-pay | Admitting: Student

## 2024-03-05 VITALS — BP 144/80 | HR 87 | Temp 98.4°F | Resp 16 | Ht 68.0 in | Wt 122.1 lb

## 2024-03-05 DIAGNOSIS — M2550 Pain in unspecified joint: Secondary | ICD-10-CM

## 2024-03-05 DIAGNOSIS — N6011 Diffuse cystic mastopathy of right breast: Secondary | ICD-10-CM

## 2024-03-05 DIAGNOSIS — Z0000 Encounter for general adult medical examination without abnormal findings: Secondary | ICD-10-CM

## 2024-03-05 DIAGNOSIS — N6012 Diffuse cystic mastopathy of left breast: Secondary | ICD-10-CM

## 2024-03-05 DIAGNOSIS — F39 Unspecified mood [affective] disorder: Secondary | ICD-10-CM

## 2024-03-05 NOTE — Patient Instructions (Signed)
 It was nice to see you today!  If you have any problems before your next visit feel free to message me via MyChart (minor issues or questions) or call the office, otherwise you may reach out to schedule an office visit.  Thank you! Pau Banh, PA-C

## 2024-03-05 NOTE — Progress Notes (Unsigned)
 Complete physical exam  Patient: Sara Mcneil   DOB: [birth year]   27 y.o. Female  MRN: [id number]  Subjective:    Chief Complaint  Patient presents with   Annual Exam    Here for annual exam. Her and her daughter been getting canker sores more often. Is waiting for a provider at her children's office to see her for borderline personality disorder and bipolar.    Discussed the use of AI scribe software for clinical note transcription with the patient, who gave verbal consent to proceed.  History of Present Illness   Sara Mcneil is a 28 year old female who presents for an annual physical exam.  She has a history of borderline personality disorder and bipolar disorder since childhood. She is currently awaiting an appointment with a provider at [facility], where her two sons are also patients. She prefers in-person appointments to ensure attendance.  She experiences fluctuations in her eating habits, alternating between periods of increased appetite and times when food is unappealing. Despite these fluctuations, her weight has remained stable throughout her life, except during pregnancy. She notes that her weight has been consistent post-pregnancy.  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  Her sleep is often disrupted by her child waking up at 3 AM, resulting in an average of five hours of sleep per night, which she acknowledges is insufficient.  She follows up with an eye doctor annually but often forgets to wear her prescribed glasses. She also sees a dentist yearly and reports no current dental issues.  She has a history of persistent joint pain, particularly in her legs. She was discharged from physical therapy, but the pain has not significantly changed. Additionally, she and her daughter frequently experience canker sores, with both having had about five in the past week and  a half. These sores typically last four to five days before resolving.      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   Encounter for sterilization 03/20/2020   Low maternal serum vitamin B12 01/13/2018   Frequent UTI 11/12/2017   Anxiety 11/12/2017   Past Medical History:  Diagnosis Date   Anemia    Anxiety    Bipolar 2 disorder (HCC)    Borderline personality disorder (HCC)    Chronic UTI    HPV (human papilloma virus) infection    Kidney stones    PID (acute pelvic inflammatory disease)    Postpartum care following vaginal delivery 02/18/2018   Preterm delivery    Vulvodynia    Yeast infection    Social History   Tobacco Use   Smoking status: Never    Passive exposure: Never   Smokeless tobacco: Never  Vaping Use   Vaping status: Never Used  Substance Use Topics   Alcohol use: Yes    Comment: SOCIALLY   Drug use: Not Currently   Allergies  Allergen Reactions   Iodinated Contrast Media Hives    Itching and small hives s/p iodine injection   Sulfa Antibiotics Hives      Patient Care Team: Chihiro Frey T, PA-C as PCP - General (Physician Assistant)   Outpatient Medications Prior to Visit  Medication Sig   pregabalin  (LYRICA ) 25 MG capsule Take 1 capsule (25 mg total) by mouth 2 (two) times daily. (Patient not taking: Reported on 03/05/2024)   No facility-administered medications prior to visit.    ROS  Per HPI      Objective:     BP (!) 144/80   Pulse 87   Temp 98.4 F (36.9 C) (Oral)   Resp 16   Ht 5' 8 (1.727 m)   Wt 122 lb 1.6 oz (55.4 kg)   LMP 02/24/2024 (Approximate)   SpO2 97%   BMI 18.57 kg/m  BP Readings from Last 3 Encounters:  03/05/24 (!) 144/80  12/05/23 105/64  01/24/23 103/64   Wt Readings from Last 3 Encounters:  03/05/24 122 lb 1.6 oz (55.4 kg)  12/05/23 121 lb 12.8 oz (55.2 kg)  01/24/23 119 lb 6.4 oz (54.2 kg)      Physical Exam Exam conducted with a chaperone present.  Constitutional:      General: She is not in acute distress.    Appearance: Normal appearance. She is not ill-appearing or diaphoretic.  HENT:     Head: Normocephalic and atraumatic.     Right Ear: Tympanic membrane, ear canal and external ear normal.     Left Ear: Tympanic membrane, ear canal and external ear normal.     Nose: Nose normal.     Mouth/Throat:     Mouth: Mucous membranes are moist.     Pharynx: Oropharynx is clear.  Eyes:     General: No scleral icterus.       Right eye: No discharge.        Left eye: No discharge.     Extraocular Movements: Extraocular movements intact.     Conjunctiva/sclera: Conjunctivae normal.     Pupils: Pupils are equal, round, and reactive to light.  Neck:     Thyroid: No thyroid mass, thyromegaly or thyroid tenderness.     Vascular: No carotid bruit.  Cardiovascular:     Rate and Rhythm: Normal rate and regular rhythm.     Pulses: Normal pulses.     Heart sounds: Normal heart sounds. No murmur heard.    No friction rub. No gallop.  Pulmonary:     Effort: Pulmonary effort is normal.     Breath sounds: Normal breath sounds. No wheezing, rhonchi or rales.  Chest:     Chest wall: No tenderness.  Breasts:    Right: Normal.     Left: Normal.     Comments: Slight bilateral lateral sided tenderness during breast exam Abdominal:     General: Bowel sounds are normal. There is no distension.     Palpations: Abdomen is soft.     Tenderness: There  is no abdominal tenderness. There is no guarding.  Musculoskeletal:        General: No swelling, deformity or signs of injury.     Cervical back: Neck supple.     Right lower leg: No edema.     Left lower leg: No edema.  Lymphadenopathy:     Cervical: No  cervical adenopathy.     Right cervical: No superficial or posterior cervical adenopathy.    Left cervical: No superficial cervical adenopathy.  Skin:    Coloration: Skin is not jaundiced.     Findings: No rash.  Neurological:     General: No focal deficit present.     Mental Status: She is alert and oriented to person, place, and time.     Motor: No weakness.  Psychiatric:        Behavior: Behavior normal.      No results found for any visits on 03/05/24. Last CBC Lab Results  Component Value Date   WBC 6.6 12/05/2023   HGB 12.3 12/05/2023   HCT 36.6 12/05/2023   MCV 92 12/05/2023   MCH 30.9 12/05/2023   RDW 12.2 12/05/2023   PLT 566 (H) 12/05/2023   Last metabolic panel Lab Results  Component Value Date   GLUCOSE 116 (H) 12/05/2023   NA 138 12/05/2023   K 3.8 12/05/2023   CL 104 12/05/2023   CO2 21 12/05/2023   BUN 12 12/05/2023   CREATININE 0.71 12/05/2023   EGFR 119 12/05/2023   CALCIUM  9.6 12/05/2023   PROT 6.7 12/05/2023   ALBUMIN 4.3 12/05/2023   LABGLOB 2.4 12/05/2023   BILITOT 0.3 12/05/2023   ALKPHOS 75 12/05/2023   AST 21 12/05/2023   ALT 17 12/05/2023   ANIONGAP 3 (L) 03/30/2022   Last lipids No results found for: CHOL, HDL, LDLCALC, LDLDIRECT, TRIG, CHOLHDL Last hemoglobin A1c No results found for: HGBA1C      Assessment & Plan:    Routine Health Maintenance and Physical Exam  Health Maintenance  Topic Date Due   Flu Shot  03/06/2024   COVID-19 Vaccine (1) 03/06/2024*   Pap Smear  01/23/2026   DTaP/Tdap/Td vaccine (4 - Td or Tdap) 01/21/2030   Hepatitis B Vaccine  Completed   HPV Vaccine  Completed   Hepatitis C Screening  Completed   HIV Screening  Completed    Meningitis B Vaccine  Aged Out  *Topic was postponed. The date shown is not the original due date.    Discussed health benefits of physical activity, and encouraged her to engage in regular exercise appropriate for her age and condition.  Assessment and Plan    Adult Wellness Visit Annual wellness exam conducted. No significant abnormalities in physical examination. Reports stable weight and regular exercise through her job. Sleep is suboptimal at approximately 5 hours per night. Vaccinations are up to date. Laboratory results show mild thrombocytosis, but other parameters are normal. - Encourage aiming for 7-8 hours of sleep per night. - Continue regular exercise and balanced diet. - Annual follow-up for wellness exam.   Mood disorder- per patient- Borderline personality disorder and bipolar disorder Awaiting appointment with a provider at [facility]. Delay in appointment scheduling. - Follow up with [facility] for appointment scheduling.  Chronic joint pain Chronic joint pain persists, primarily in the legs. No significant changes in symptoms. - Return as needed for major joint pains.  Fibrocystic breast changes with right breast tenderness Right breast tenderness consistent with previous ultrasound findings of fibrocystic breast changes. No abnormalities on recent ultrasound. Tenderness exacerbated by physical contact.     Return in about 1 year (around 03/05/2025) for Annual Physical.     Lang DASEN Jaeden [place], PA-C

## 2024-03-06 DIAGNOSIS — F39 Unspecified mood [affective] disorder: Secondary | ICD-10-CM | POA: Insufficient documentation

## 2024-03-06 DIAGNOSIS — N6011 Diffuse cystic mastopathy of right breast: Secondary | ICD-10-CM | POA: Insufficient documentation

## 2024-03-09 ENCOUNTER — Encounter (HOSPITAL_BASED_OUTPATIENT_CLINIC_OR_DEPARTMENT_OTHER): Payer: MEDICAID | Admitting: Student

## 2024-03-20 ENCOUNTER — Other Ambulatory Visit: Payer: Self-pay | Admitting: Medical Genetics

## 2024-03-24 ENCOUNTER — Other Ambulatory Visit
Admission: RE | Admit: 2024-03-24 | Discharge: 2024-03-24 | Disposition: A | Discharge status: Home / Self Care | Payer: MEDICAID | Source: Ambulatory Visit | Attending: Medical Genetics | Admitting: Medical Genetics

## 2024-04-03 LAB — GENECONNECT MOLECULAR SCREEN: Genetic Analysis Overall Interpretation: NEGATIVE

## 2024-05-01 ENCOUNTER — Encounter (HOSPITAL_BASED_OUTPATIENT_CLINIC_OR_DEPARTMENT_OTHER): Payer: Self-pay

## 2024-05-01 ENCOUNTER — Ambulatory Visit (HOSPITAL_BASED_OUTPATIENT_CLINIC_OR_DEPARTMENT_OTHER)
Admission: EM | Admit: 2024-05-01 | Discharge: 2024-05-01 | Disposition: A | Discharge status: Home / Self Care | Payer: MEDICAID | Attending: Family Medicine | Admitting: Family Medicine

## 2024-05-01 ENCOUNTER — Ambulatory Visit (HOSPITAL_BASED_OUTPATIENT_CLINIC_OR_DEPARTMENT_OTHER): Payer: MEDICAID

## 2024-05-01 DIAGNOSIS — M545 Low back pain, unspecified: Secondary | ICD-10-CM | POA: Insufficient documentation

## 2024-05-01 DIAGNOSIS — R102 Pelvic and perineal pain: Secondary | ICD-10-CM | POA: Insufficient documentation

## 2024-05-01 LAB — POCT URINE DIPSTICK
Bilirubin, UA: NEGATIVE
Blood, UA: NEGATIVE
Glucose, UA: NEGATIVE mg/dL
Ketones, POC UA: NEGATIVE mg/dL
Nitrite, UA: NEGATIVE
Protein Ur, POC: NEGATIVE mg/dL
Spec Grav, UA: 1.02 (ref 1.010–1.025)
Urobilinogen, UA: 0.2 U/dL
pH, UA: 7 (ref 5.0–8.0)

## 2024-05-01 MED ORDER — METRONIDAZOLE 500 MG PO TABS: 500.0000 mg | ORAL_TABLET | Freq: Two times a day (BID) | ORAL | 0 refills | Status: AC

## 2024-05-01 NOTE — ED Provider Notes (Signed)
 [facility]    CSN: [id number] Arrival date & time: 05/01/24  1456      History   Chief Complaint Chief Complaint  Patient presents with   Back Pain    HPI Sara Mcneil is a 28 y.o. female.   Patient is a 28 year old female who presents today with  low back pain for the last week. She has also been feeling nauseous and having pelvic pain-cramping. Pt denies dysuria, frequency or urgency.  She has also had some increased discharge with odor.  Currently has 1 partner for many years and not specifically concern for STIs at this time.  No fever.    Back Pain   Past Medical History:  Diagnosis Date   Anemia    Anxiety    Bipolar 2 disorder (HCC)    Borderline personality disorder (HCC)    Chronic UTI    HPV (human papilloma virus) infection    Kidney stones    PID (acute pelvic inflammatory disease)    Postpartum care following vaginal delivery 02/18/2018   Preterm delivery    Vulvodynia    Yeast infection     Patient Active Problem List   Diagnosis Date Noted   Fibrocystic breast changes, bilateral 03/06/2024   Mood disorder 03/06/2024   Pain of both shoulder joints 12/05/2023   Weight loss 12/05/2023   Facial rash 12/05/2023   Vulvodynia, 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   Encounter for sterilization 03/20/2020   Low maternal serum vitamin B12 01/13/2018   Frequent UTI 11/12/2017   Anxiety 11/12/2017    Past Surgical History:  Procedure Laterality Date   TUBAL LIGATION Bilateral 03/20/2020   Procedure: POST PARTUM TUBAL LIGATION;  Surgeon: Leonce Garnette BIRCH, MD;  Location: [facility] ORS;  Service: Gynecology;  Laterality: Bilateral;    OB History     Gravida  4   Para  3   Term  1   Preterm  2   AB  1   Living  3      SAB      IAB      Ectopic      Multiple  0   Live Births  3            Home Medications    Prior to Admission medications   Medication Sig Start Date End Date Taking? Authorizing Provider  metroNIDAZOLE   (FLAGYL ) 500 MG tablet Take 1 tablet (500 mg total) by mouth 2 (two) times daily. 05/01/24  Yes Brieanna Nau A, FNP  QUEtiapine (SEROQUEL) 50 MG tablet Take 25 mg by mouth daily. 04/23/24  Yes [provider]    Family History Family History  Problem Relation Age of Onset   ADD / ADHD Mother    Anxiety disorder Mother    Depression Mother    Learning disabilities Mother    ADD / ADHD Son    Autism Son    Breast cancer Neg Hx    Ovarian cancer Neg Hx     Social History Social History   Tobacco Use   Smoking status: Never    Passive exposure: Never   Smokeless tobacco: Never  Vaping Use   Vaping status: Never Used  Substance Use Topics   Alcohol use: Yes    Comment: SOCIALLY   Drug use: Not Currently     Allergies   Iodinated contrast media and Sulfa antibiotics   Review of Systems Review of Systems  Musculoskeletal:  Positive for back pain.     Physical  Exam Triage Vital Signs ED Triage Vitals  Encounter Vitals Group     BP 05/01/24 1526 109/66     Girls Systolic BP Percentile --      Girls Diastolic BP Percentile --      Boys Systolic BP Percentile --      Boys Diastolic BP Percentile --      Pulse Rate 05/01/24 1526 71     Resp 05/01/24 1526 20     Temp 05/01/24 1526 98.3 F (36.8 C)     Temp Source 05/01/24 1526 Oral     SpO2 05/01/24 1526 98 %     Weight --      Height --      Head Circumference --      Peak Flow --      Pain Score 05/01/24 1524 7     Pain Loc --      Pain Education --      Exclude from Growth Chart --    No data found.  Updated Vital Signs BP 109/66 (BP Location: Right Arm)   Pulse 71   Temp 98.3 F (36.8 C) (Oral)   Resp 20   LMP 04/18/2024 (Approximate)   SpO2 98%   Visual Acuity Right Eye Distance:   Left Eye Distance:   Bilateral Distance:    Right Eye Near:   Left Eye Near:    Bilateral Near:     Physical Exam Vitals and nursing note reviewed.  Constitutional:      General: She is not in acute  distress.    Appearance: Normal appearance. She is not ill-appearing, toxic-appearing or diaphoretic.  Pulmonary:     Effort: Pulmonary effort is normal.  Neurological:     Mental Status: She is alert.  Psychiatric:        Mood and Affect: Mood normal.      UC Treatments / Results  Labs (all labs ordered are listed, but only abnormal results are displayed) Labs Reviewed  POCT URINE DIPSTICK - Abnormal; Notable for the following components:      Result Value   Leukocytes, UA Trace (*)    All other components within normal limits  URINE CULTURE  CERVICOVAGINAL ANCILLARY ONLY    EKG   Radiology No results found.  Procedures Procedures (including critical care time)  Medications Ordered in UC Medications - No data to display  Initial Impression / Assessment and Plan / UC Course  I have reviewed the triage vital signs and the nursing notes.  Pertinent labs & imaging results that were available during my care of the patient were reviewed by me and considered in my medical decision making (see chart for details).     Pelvic pain, back pain, discharge. Urine with trace bacteria but no specific concerns for UTI. Sending for culture. Swab also sent for testing. Opted to go ahead and treat at this time for BV based on symptoms and hx. Treating with Flagyl .  ER precautions given.  Final Clinical Impressions(s) / UC Diagnoses   Final diagnoses:  Pelvic pain     Discharge Instructions      Urine had trace bacteria but did not show anything obvious for urinary tract infection.  We will culture the urine. Swab sent for testing and we will call with any positive results.  We will go ahead and treat you for BV in the meantime. If your symptoms worsen you will need to go to the ER.    ED Prescriptions  Medication Sig Dispense Auth. Provider   metroNIDAZOLE  (FLAGYL ) 500 MG tablet Take 1 tablet (500 mg total) by mouth 2 (two) times daily. 14 tablet Adah Wilbert LABOR, FNP       PDMP not reviewed this encounter.   Adah Wilbert LABOR, FNP 05/02/24 [phone]

## 2024-05-01 NOTE — Discharge Instructions (Signed)
 Urine had trace bacteria but did not show anything obvious for urinary tract infection.  We will culture the urine. Swab sent for testing and we will call with any positive results.  We will go ahead and treat you for BV in the meantime. If your symptoms worsen you will need to go to the ER.

## 2024-05-01 NOTE — ED Triage Notes (Signed)
 Pt c/o low back pain for the last week. She has also been feeling nauseous and having pelvic pain-cramping. Pt denies dysuria, frequency or urgency. Pt states this is how she normally feels when she has UTI's.

## 2024-05-02 LAB — URINE CULTURE: Culture: <10000 — AB

## 2024-05-04 ENCOUNTER — Ambulatory Visit (HOSPITAL_COMMUNITY): Payer: Self-pay

## 2024-05-05 LAB — CERVICOVAGINAL ANCILLARY ONLY
Bacterial Vaginitis (gardnerella): POSITIVE — AB
Candida Glabrata: NEGATIVE
Candida Vaginitis: NEGATIVE
Comment: NEGATIVE
Comment: NEGATIVE
Comment: NEGATIVE

## 2024-09-22 ENCOUNTER — Ambulatory Visit: Payer: MEDICAID | Admitting: Licensed Practical Nurse

## 2025-03-08 ENCOUNTER — Encounter (HOSPITAL_BASED_OUTPATIENT_CLINIC_OR_DEPARTMENT_OTHER): Payer: MEDICAID | Admitting: Student
